# Patient Record
Sex: Male | Born: 1955 | Race: White | Hispanic: No | Smoking: Never smoker
Health system: Southern US, Community
[De-identification: ages and names within clinical notes are randomized; demographics above are authoritative.]

## PROBLEM LIST (undated history)

## (undated) DIAGNOSIS — T8859XA Other complications of anesthesia, initial encounter: Secondary | ICD-10-CM

## (undated) DIAGNOSIS — R112 Nausea with vomiting, unspecified: Secondary | ICD-10-CM

## (undated) DIAGNOSIS — E785 Hyperlipidemia, unspecified: Secondary | ICD-10-CM

## (undated) DIAGNOSIS — N189 Chronic kidney disease, unspecified: Secondary | ICD-10-CM

## (undated) DIAGNOSIS — I209 Angina pectoris, unspecified: Secondary | ICD-10-CM

## (undated) DIAGNOSIS — Z9889 Other specified postprocedural states: Secondary | ICD-10-CM

## (undated) DIAGNOSIS — M1990 Unspecified osteoarthritis, unspecified site: Secondary | ICD-10-CM

## (undated) DIAGNOSIS — T4145XA Adverse effect of unspecified anesthetic, initial encounter: Secondary | ICD-10-CM

## (undated) DIAGNOSIS — G039 Meningitis, unspecified: Secondary | ICD-10-CM

## (undated) DIAGNOSIS — L0390 Cellulitis, unspecified: Secondary | ICD-10-CM

## (undated) HISTORY — PX: COLONOSCOPY: SHX174

## (undated) HISTORY — PX: APPENDECTOMY: SHX54

## (undated) HISTORY — DX: Cellulitis, unspecified: L03.90

## (undated) HISTORY — PX: KNEE ARTHROPLASTY: SHX992

## (undated) HISTORY — DX: Meningitis, unspecified: G03.9

## (undated) HISTORY — PX: CHOLECYSTECTOMY: SHX55

## (undated) HISTORY — PX: DENTAL SURGERY: SHX609

## (undated) HISTORY — PX: ELBOW SURGERY: SHX618

---

## 2010-12-11 DIAGNOSIS — T8454XA Infection and inflammatory reaction due to internal left knee prosthesis, initial encounter: Secondary | ICD-10-CM | POA: Insufficient documentation

## 2010-12-11 HISTORY — PX: IRRIGATION AND DEBRIDEMENT KNEE: SHX5185

## 2010-12-11 HISTORY — DX: Infection and inflammatory reaction due to internal left knee prosthesis, initial encounter: T84.54XA

## 2011-12-21 DIAGNOSIS — M25569 Pain in unspecified knee: Secondary | ICD-10-CM

## 2011-12-21 DIAGNOSIS — M24569 Contracture, unspecified knee: Secondary | ICD-10-CM

## 2011-12-21 HISTORY — DX: Contracture, unspecified knee: M24.569

## 2011-12-21 HISTORY — DX: Pain in unspecified knee: M25.569

## 2014-04-11 ENCOUNTER — Other Ambulatory Visit: Payer: Self-pay | Admitting: Orthopaedic Surgery

## 2014-04-14 ENCOUNTER — Other Ambulatory Visit (HOSPITAL_COMMUNITY): Payer: Self-pay

## 2014-04-16 ENCOUNTER — Other Ambulatory Visit (HOSPITAL_COMMUNITY): Payer: Self-pay

## 2014-04-29 ENCOUNTER — Encounter (HOSPITAL_COMMUNITY): Admission: RE | Payer: Self-pay | Source: Ambulatory Visit

## 2014-04-29 ENCOUNTER — Inpatient Hospital Stay (HOSPITAL_COMMUNITY): Admission: RE | Admit: 2014-04-29 | Payer: Self-pay | Source: Ambulatory Visit | Admitting: Orthopaedic Surgery

## 2014-04-29 SURGERY — ARTHROPLASTY, HIP, TOTAL, ANTERIOR APPROACH
Anesthesia: Choice | Laterality: Left

## 2014-05-22 ENCOUNTER — Other Ambulatory Visit: Payer: Self-pay | Admitting: Orthopaedic Surgery

## 2014-06-27 ENCOUNTER — Encounter (HOSPITAL_COMMUNITY): Payer: Self-pay

## 2014-06-27 ENCOUNTER — Other Ambulatory Visit: Payer: Self-pay | Admitting: Orthopaedic Surgery

## 2014-06-27 ENCOUNTER — Encounter (HOSPITAL_COMMUNITY)
Admission: RE | Admit: 2014-06-27 | Discharge: 2014-06-27 | Disposition: A | Discharge status: Home / Self Care | Payer: PRIVATE HEALTH INSURANCE | Source: Ambulatory Visit | Attending: Orthopaedic Surgery | Admitting: Orthopaedic Surgery

## 2014-06-27 DIAGNOSIS — Z01818 Encounter for other preprocedural examination: Secondary | ICD-10-CM | POA: Insufficient documentation

## 2014-06-27 DIAGNOSIS — Z01810 Encounter for preprocedural cardiovascular examination: Secondary | ICD-10-CM | POA: Diagnosis not present

## 2014-06-27 DIAGNOSIS — Z01812 Encounter for preprocedural laboratory examination: Secondary | ICD-10-CM | POA: Diagnosis not present

## 2014-06-27 HISTORY — DX: Unspecified osteoarthritis, unspecified site: M19.90

## 2014-06-27 HISTORY — DX: Other specified postprocedural states: Z98.890

## 2014-06-27 HISTORY — DX: Other complications of anesthesia, initial encounter: T88.59XA

## 2014-06-27 HISTORY — DX: Hyperlipidemia, unspecified: E78.5

## 2014-06-27 HISTORY — DX: Adverse effect of unspecified anesthetic, initial encounter: T41.45XA

## 2014-06-27 HISTORY — DX: Nausea with vomiting, unspecified: R11.2

## 2014-06-27 LAB — URINALYSIS, ROUTINE W REFLEX MICROSCOPIC
Bilirubin Urine: NEGATIVE
Glucose, UA: NEGATIVE mg/dL
Hgb urine dipstick: NEGATIVE
Ketones, ur: NEGATIVE mg/dL
Leukocytes, UA: NEGATIVE
Nitrite: NEGATIVE
PH: 5 (ref 5.0–8.0)
Protein, ur: NEGATIVE mg/dL
Specific Gravity, Urine: 1.018 (ref 1.005–1.030)
Urobilinogen, UA: 0.2 mg/dL (ref 0.0–1.0)

## 2014-06-27 LAB — CBC WITH DIFFERENTIAL/PLATELET
BASOS PCT: 1 % (ref 0–1)
Basophils Absolute: 0.1 10*3/uL (ref 0.0–0.1)
EOS ABS: 0.2 10*3/uL (ref 0.0–0.7)
EOS PCT: 4 % (ref 0–5)
HEMATOCRIT: 49.9 % (ref 39.0–52.0)
Hemoglobin: 16.5 g/dL (ref 13.0–17.0)
LYMPHS PCT: 25 % (ref 12–46)
Lymphs Abs: 1.5 10*3/uL (ref 0.7–4.0)
MCH: 30.7 pg (ref 26.0–34.0)
MCHC: 33.1 g/dL (ref 30.0–36.0)
MCV: 92.8 fL (ref 78.0–100.0)
Monocytes Absolute: 0.6 10*3/uL (ref 0.1–1.0)
Monocytes Relative: 9 % (ref 3–12)
Neutro Abs: 3.7 10*3/uL (ref 1.7–7.7)
Neutrophils Relative %: 61 % (ref 43–77)
PLATELETS: 234 10*3/uL (ref 150–400)
RBC: 5.38 MIL/uL (ref 4.22–5.81)
RDW: 13.4 % (ref 11.5–15.5)
WBC: 6 10*3/uL (ref 4.0–10.5)

## 2014-06-27 LAB — BASIC METABOLIC PANEL
Anion gap: 8 (ref 5–15)
BUN: 11 mg/dL (ref 6–23)
CALCIUM: 9.4 mg/dL (ref 8.4–10.5)
CO2: 25 mmol/L (ref 19–32)
Chloride: 104 mmol/L (ref 96–112)
Creatinine, Ser: 0.82 mg/dL (ref 0.50–1.35)
Glucose, Bld: 127 mg/dL — ABNORMAL HIGH (ref 70–99)
POTASSIUM: 4.9 mmol/L (ref 3.5–5.1)
SODIUM: 137 mmol/L (ref 135–145)

## 2014-06-27 LAB — SURGICAL PCR SCREEN
MRSA, PCR: NEGATIVE
Staphylococcus aureus: NEGATIVE

## 2014-06-27 LAB — APTT: APTT: 30 s (ref 24–37)

## 2014-06-27 LAB — PROTIME-INR
INR: 1.01 (ref 0.00–1.49)
Prothrombin Time: 13.4 seconds (ref 11.6–15.2)

## 2014-06-27 NOTE — Progress Notes (Signed)
Patient arrived to PAT. [name] (Interpreter at chair side) during PAT visit. Nurse also called patients sister [name] to chair side during PAT visit. PCP is [name]. Patient denied having any cardiac or pulmonary issues. Patient informed Nurse that he had a stress test in 2011 in [place] however patient and sister were both unaware of place or physician. Will attempt to request records from [facility]. Patient denied having a cardiac cath or sleep study.  During PAT visit Nurse noticed that patients DOB was [birth year] and patient informed Nurse that his DOB is [birth year]. Nurse called admissions and had it changed. When asked about procedure patient was having done patient informed Nurse that he was having surgery on his right hip. Consent form stated surgery was on "left" hip. Nurse then called Sandi Raveling at Dr. Jerald Kief office and left a voicemail informing her of this. [name] returned call and stated that she would modify consent form. Consent will be signed DOS. Patient informed of this.

## 2014-06-27 NOTE — Pre-Procedure Instructions (Signed)
Zachary Lopez  06/27/2014   Your procedure is scheduled on:  Tuesday July 08, 2014 at 12:45 PM.  Report to [facility] Admitting at 10:45 AM.  Call this number if you have problems the morning of surgery: [phone]   Remember:   Do not eat food or drink liquids after midnight.   Take these medicines the morning of surgery with A SIP OF WATER: Acetaminophen (Tylenol) if needed   Please stop taking any vitamins, herbal medications, Advil, Motrin, Alleve, etc on Tuesday April 19th   Do not wear jewelry.  Do not wear lotions, powders, or cologne.   Men may shave face and neck.  Do not bring valuables to the hospital.  [facility] is not responsible for any belongings or valuables.               Contacts, dentures or bridgework may not be worn into surgery.  Leave suitcase in the car. After surgery it may be brought to your room.  For patients admitted to the hospital, discharge time is determined by your treatment team.               Patients discharged the day of surgery will not be allowed to drive home.  Name and phone number of your driver:   Special Instructions: Shower using CHG soap the night before and the morning of your surgery   Please read over the following fact sheets that you were given: Pain Booklet, Coughing and Deep Breathing, Total Joint Packet, MRSA Information and Surgical Site Infection Prevention

## 2014-07-07 ENCOUNTER — Other Ambulatory Visit: Payer: Self-pay | Admitting: Orthopaedic Surgery

## 2014-07-07 MED ORDER — LACTATED RINGERS IV SOLN
INTRAVENOUS | Status: DC
  Administered 2014-07-08 (×2): via INTRAVENOUS

## 2014-07-07 MED ORDER — CEFAZOLIN SODIUM-DEXTROSE 2-3 GM-% IV SOLR
2.0000 g | INTRAVENOUS | Status: AC
Start: 2014-07-08 — End: 2014-07-08
  Administered 2014-07-08: 2 g via INTRAVENOUS
  Filled 2014-07-07: qty 50

## 2014-07-07 MED ORDER — CHLORHEXIDINE GLUCONATE 4 % EX LIQD
60.0000 mL | Freq: Once | CUTANEOUS | Status: DC
  Filled 2014-07-07: qty 60

## 2014-07-07 NOTE — Progress Notes (Signed)
I called to inform the patient that there has been a time change for surgery in the am and new arrival time of 9. I spoke with the sister and she said there is no way they can be here at that time d/t someone coming from [place] of [place] to bring him. I told her that she needs to call Dr.Dalldorf's office and speak with his scheduler and let her know of concern. She states she will do that.

## 2014-07-07 NOTE — Progress Notes (Signed)
Patient's surgery was rescheduled to 0930, I called patient' sister-in-law, ,Winona Legato and asked him to arrive at 0730.  Mrs.  McKenzie stated that patient can not arrive until approximately 1030, "someone is coming from [place] to bring him and that is the [place], I spoke with Caralyn Guile today and she said she would work on it."  (Patient had been called earlier and instructed to arrive at 0900.)  I called and notified  Loni Dolly, [place] , he said he would work on re arranging patients. Patient was then rescheduled for 11:05, I spoke with Darlene at [place] desk and she said that PA did report to her that patient can not arrive until approx 10:30.

## 2014-07-07 NOTE — H&P (Signed)
TOTAL HIP ADMISSION H&P  Patient is admitted for right total hip arthroplasty.  Subjective:  Chief Complaint: right hip pain  HPI: Zachary Lopez, 59 y.o. male, has a history of pain and functional disability in the right hip(s) due to arthritis and patient has failed non-surgical conservative treatments for greater than 12 weeks to include NSAID's and/or analgesics, flexibility and strengthening excercises, use of assistive devices, weight reduction as appropriate and activity modification.  Onset of symptoms was gradual starting 5 years ago with gradually worsening course since that time.The patient noted no past surgery on the right hip(s).  Patient currently rates pain in the right hip at 10 out of 10 with activity. Patient has night pain, worsening of pain with activity and weight bearing, trendelenberg gait, pain that interfers with activities of daily living and crepitus. Patient has evidence of subchondral cysts, subchondral sclerosis, periarticular osteophytes and joint space narrowing by imaging studies. This condition presents safety issues increasing the risk of falls.  There is no current active infection.  There are no active problems to display for this patient.  Past Medical History  Diagnosis Date  . Complication of anesthesia   . PONV (postoperative nausea and vomiting)   . Hyperlipemia   . Arthritis     Past Surgical History  Procedure Laterality Date  . Knee arthroplasty Left   . Appendectomy    . Elbow surgery Left   . Colonoscopy    . Dental surgery      No prescriptions prior to admission   No Known Allergies  History  Substance Use Topics  . Smoking status: Never Smoker   . Smokeless tobacco: Not on file  . Alcohol Use: No    No family history on file.   Review of Systems  Musculoskeletal: Positive for joint pain.       Right hip  All other systems reviewed and are negative.   Objective:  Physical Exam  Constitutional: He is oriented to person,  place, and time. He appears well-developed and well-nourished.  HENT:  Head: Normocephalic and atraumatic.  Eyes: Pupils are equal, round, and reactive to light.  Cardiovascular: Normal rate and regular rhythm.   Respiratory: Effort normal.  GI: Soft.  Musculoskeletal:  Right hip motion is quite limited. He walks with a markedly altered gait. His leg lengths are roughly equal. I do not see much of a hip flexion contracture. Both of his knees move relatively well. Sensation and motor function are intact in his feet with palpable pulses on both sides.  Neurological: He is alert and oriented to person, place, and time.  Skin: Skin is warm and dry.  Psychiatric: He has a normal mood and affect. His behavior is normal. Judgment and thought content normal.    Vital signs in last 24 hours:    Labs:   There is no height or weight on file to calculate BMI.   Imaging Review Plain radiographs demonstrate severe degenerative joint disease of the right hip(s). The bone quality appears to be good for age and reported activity level.  Assessment/Plan:  End stage primary arthritis, right hip(s)  The patient history, physical examination, clinical judgement of the provider and imaging studies are consistent with end stage degenerative joint disease of the right hip(s) and total hip arthroplasty is deemed medically necessary. The treatment options including medical management, injection therapy, arthroscopy and arthroplasty were discussed at length. The risks and benefits of total hip arthroplasty were presented and reviewed. The risks due to aseptic  loosening, infection, stiffness, dislocation/subluxation,  thromboembolic complications and other imponderables were discussed.  The patient acknowledged the explanation, agreed to proceed with the plan and consent was signed. Patient is being admitted for inpatient treatment for surgery, pain control, PT, OT, prophylactic antibiotics, VTE prophylaxis,  progressive ambulation and ADL's and discharge planning.The patient is planning to be discharged to skilled nursing facility

## 2014-07-08 ENCOUNTER — Encounter (HOSPITAL_COMMUNITY)
Admission: RE | Disposition: A | Discharge status: Home Health | Payer: Self-pay | Source: Ambulatory Visit | Attending: Orthopaedic Surgery

## 2014-07-08 ENCOUNTER — Inpatient Hospital Stay (HOSPITAL_COMMUNITY): Payer: PRIVATE HEALTH INSURANCE | Admitting: Anesthesiology

## 2014-07-08 ENCOUNTER — Encounter (HOSPITAL_COMMUNITY): Payer: Self-pay | Admitting: *Deleted

## 2014-07-08 ENCOUNTER — Inpatient Hospital Stay (HOSPITAL_COMMUNITY): Payer: PRIVATE HEALTH INSURANCE

## 2014-07-08 ENCOUNTER — Inpatient Hospital Stay (HOSPITAL_COMMUNITY)
Admission: RE | Admit: 2014-07-08 | Discharge: 2014-07-11 | DRG: 470 | Disposition: A | Discharge status: Home Health | Payer: PRIVATE HEALTH INSURANCE | Source: Ambulatory Visit | Attending: Orthopaedic Surgery | Admitting: Orthopaedic Surgery

## 2014-07-08 DIAGNOSIS — E785 Hyperlipidemia, unspecified: Secondary | ICD-10-CM | POA: Diagnosis present

## 2014-07-08 DIAGNOSIS — Z7982 Long term (current) use of aspirin: Secondary | ICD-10-CM

## 2014-07-08 DIAGNOSIS — M25551 Pain in right hip: Secondary | ICD-10-CM | POA: Diagnosis present

## 2014-07-08 DIAGNOSIS — M1611 Unilateral primary osteoarthritis, right hip: Secondary | ICD-10-CM

## 2014-07-08 DIAGNOSIS — Z419 Encounter for procedure for purposes other than remedying health state, unspecified: Secondary | ICD-10-CM

## 2014-07-08 HISTORY — DX: Unilateral primary osteoarthritis, right hip: M16.11

## 2014-07-08 HISTORY — PX: TOTAL HIP ARTHROPLASTY: SHX124

## 2014-07-08 SURGERY — ARTHROPLASTY, HIP, TOTAL, ANTERIOR APPROACH
Anesthesia: Monitor Anesthesia Care | Site: Hip | Laterality: Right

## 2014-07-08 MED ORDER — PHENYLEPHRINE 40 MCG/ML (10ML) SYRINGE FOR IV PUSH (FOR BLOOD PRESSURE SUPPORT)
PREFILLED_SYRINGE | INTRAVENOUS | Status: AC
  Filled 2014-07-08: qty 20

## 2014-07-08 MED ORDER — HYDROCODONE-ACETAMINOPHEN 5-325 MG PO TABS
1.0000 | ORAL_TABLET | ORAL | Status: DC | PRN
  Administered 2014-07-09 – 2014-07-11 (×8): 2 via ORAL
  Filled 2014-07-08 (×8): qty 2

## 2014-07-08 MED ORDER — PROPOFOL 10 MG/ML IV BOLUS
INTRAVENOUS | Status: AC
  Filled 2014-07-08: qty 20

## 2014-07-08 MED ORDER — SODIUM CHLORIDE 0.9 % IJ SOLN
INTRAMUSCULAR | Status: AC
  Filled 2014-07-08: qty 10

## 2014-07-08 MED ORDER — ONDANSETRON HCL 4 MG/2ML IJ SOLN
4.0000 mg | Freq: Four times a day (QID) | INTRAMUSCULAR | Status: DC | PRN
  Administered 2014-07-08: 4 mg via INTRAVENOUS
  Filled 2014-07-08 (×2): qty 2

## 2014-07-08 MED ORDER — FENTANYL CITRATE (PF) 250 MCG/5ML IJ SOLN
INTRAMUSCULAR | Status: AC
  Filled 2014-07-08: qty 5

## 2014-07-08 MED ORDER — DIPHENHYDRAMINE HCL 12.5 MG/5ML PO ELIX: 12.5000 mg | ORAL_SOLUTION | ORAL | Status: DC | PRN

## 2014-07-08 MED ORDER — 0.9 % SODIUM CHLORIDE (POUR BTL) OPTIME
TOPICAL | Status: DC | PRN
  Administered 2014-07-08: 1000 mL

## 2014-07-08 MED ORDER — ALUM & MAG HYDROXIDE-SIMETH 200-200-20 MG/5ML PO SUSP: 30.0000 mL | ORAL | Status: DC | PRN

## 2014-07-08 MED ORDER — SODIUM CHLORIDE 0.9 % IV SOLN
Freq: Once | INTRAVENOUS | Status: DC
Start: 2014-07-08 — End: 2014-07-08

## 2014-07-08 MED ORDER — HYDROMORPHONE HCL 1 MG/ML IJ SOLN: 0.2500 mg | INTRAMUSCULAR | Status: DC | PRN

## 2014-07-08 MED ORDER — MIDAZOLAM HCL 2 MG/2ML IJ SOLN
INTRAMUSCULAR | Status: AC
  Filled 2014-07-08: qty 2

## 2014-07-08 MED ORDER — OXYCODONE HCL 5 MG/5ML PO SOLN: 5.0000 mg | Freq: Once | ORAL | Status: DC | PRN

## 2014-07-08 MED ORDER — DOCUSATE SODIUM 100 MG PO CAPS
100.0000 mg | ORAL_CAPSULE | Freq: Two times a day (BID) | ORAL | Status: DC
  Administered 2014-07-08 – 2014-07-11 (×6): 100 mg via ORAL
  Filled 2014-07-08 (×6): qty 1

## 2014-07-08 MED ORDER — METHOCARBAMOL 1000 MG/10ML IJ SOLN
500.0000 mg | Freq: Four times a day (QID) | INTRAVENOUS | Status: DC | PRN
  Filled 2014-07-08: qty 5

## 2014-07-08 MED ORDER — FENTANYL CITRATE (PF) 250 MCG/5ML IJ SOLN
INTRAMUSCULAR | Status: DC | PRN
  Administered 2014-07-08 (×3): 50 ug via INTRAVENOUS

## 2014-07-08 MED ORDER — TRANEXAMIC ACID 1000 MG/10ML IV SOLN
1000.0000 mg | INTRAVENOUS | Status: AC
  Administered 2014-07-08: 1000 mg via INTRAVENOUS
  Filled 2014-07-08: qty 10

## 2014-07-08 MED ORDER — METHOCARBAMOL 500 MG PO TABS
500.0000 mg | ORAL_TABLET | Freq: Four times a day (QID) | ORAL | Status: DC | PRN
  Administered 2014-07-08 – 2014-07-11 (×4): 500 mg via ORAL
  Filled 2014-07-08 (×5): qty 1

## 2014-07-08 MED ORDER — ASPIRIN EC 325 MG PO TBEC
325.0000 mg | DELAYED_RELEASE_TABLET | Freq: Two times a day (BID) | ORAL | Status: DC
Start: 2014-07-09 — End: 2014-07-11
  Administered 2014-07-09 – 2014-07-11 (×5): 325 mg via ORAL
  Filled 2014-07-08 (×5): qty 1

## 2014-07-08 MED ORDER — PHENOL 1.4 % MT LIQD: 1.0000 | OROMUCOSAL | Status: DC | PRN

## 2014-07-08 MED ORDER — STERILE WATER FOR INJECTION IJ SOLN
INTRAMUSCULAR | Status: AC
  Filled 2014-07-08: qty 10

## 2014-07-08 MED ORDER — MIDAZOLAM HCL 5 MG/5ML IJ SOLN
INTRAMUSCULAR | Status: DC | PRN
  Administered 2014-07-08: 2 mg via INTRAVENOUS

## 2014-07-08 MED ORDER — LIDOCAINE HCL (CARDIAC) 20 MG/ML IV SOLN
INTRAVENOUS | Status: DC | PRN
  Administered 2014-07-08: 50 mg via INTRAVENOUS

## 2014-07-08 MED ORDER — ONDANSETRON HCL 4 MG PO TABS
4.0000 mg | ORAL_TABLET | Freq: Four times a day (QID) | ORAL | Status: DC | PRN
  Filled 2014-07-08: qty 1

## 2014-07-08 MED ORDER — EPHEDRINE SULFATE 50 MG/ML IJ SOLN
INTRAMUSCULAR | Status: AC
  Filled 2014-07-08: qty 1

## 2014-07-08 MED ORDER — BUPIVACAINE HCL (PF) 0.5 % IJ SOLN
INTRAMUSCULAR | Status: DC | PRN
  Administered 2014-07-08: 3 mL

## 2014-07-08 MED ORDER — MENTHOL 3 MG MT LOZG: 1.0000 | LOZENGE | OROMUCOSAL | Status: DC | PRN

## 2014-07-08 MED ORDER — ACETAMINOPHEN 650 MG RE SUPP: 650.0000 mg | Freq: Four times a day (QID) | RECTAL | Status: DC | PRN

## 2014-07-08 MED ORDER — METOCLOPRAMIDE HCL 5 MG/ML IJ SOLN: 5.0000 mg | Freq: Three times a day (TID) | INTRAMUSCULAR | Status: DC | PRN

## 2014-07-08 MED ORDER — LACTATED RINGERS IV SOLN: INTRAVENOUS | Status: DC

## 2014-07-08 MED ORDER — PROPOFOL INFUSION 10 MG/ML OPTIME
INTRAVENOUS | Status: DC | PRN
  Administered 2014-07-08: 50 ug/kg/min via INTRAVENOUS

## 2014-07-08 MED ORDER — ACETAMINOPHEN 325 MG PO TABS: 650.0000 mg | ORAL_TABLET | Freq: Four times a day (QID) | ORAL | Status: DC | PRN

## 2014-07-08 MED ORDER — BISACODYL 5 MG PO TBEC
5.0000 mg | DELAYED_RELEASE_TABLET | Freq: Every day | ORAL | Status: DC | PRN
  Filled 2014-07-08: qty 1

## 2014-07-08 MED ORDER — PROPOFOL 10 MG/ML IV BOLUS
INTRAVENOUS | Status: DC | PRN
  Administered 2014-07-08 (×3): 40 mg via INTRAVENOUS

## 2014-07-08 MED ORDER — SUCCINYLCHOLINE CHLORIDE 20 MG/ML IJ SOLN
INTRAMUSCULAR | Status: AC
  Filled 2014-07-08: qty 1

## 2014-07-08 MED ORDER — MUPIROCIN 2 % EX OINT
1.0000 "application " | TOPICAL_OINTMENT | Freq: Once | CUTANEOUS | Status: AC
  Administered 2014-07-08: 1 via TOPICAL

## 2014-07-08 MED ORDER — LIDOCAINE HCL (CARDIAC) 20 MG/ML IV SOLN
INTRAVENOUS | Status: AC
  Filled 2014-07-08: qty 10

## 2014-07-08 MED ORDER — ROCURONIUM BROMIDE 50 MG/5ML IV SOLN
INTRAVENOUS | Status: AC
  Filled 2014-07-08: qty 1

## 2014-07-08 MED ORDER — METOCLOPRAMIDE HCL 5 MG PO TABS: 5.0000 mg | ORAL_TABLET | Freq: Three times a day (TID) | ORAL | Status: DC | PRN

## 2014-07-08 MED ORDER — CEFAZOLIN SODIUM-DEXTROSE 2-3 GM-% IV SOLR
2.0000 g | Freq: Four times a day (QID) | INTRAVENOUS | Status: AC
  Administered 2014-07-08 (×2): 2 g via INTRAVENOUS
  Filled 2014-07-08 (×2): qty 50

## 2014-07-08 MED ORDER — ROSUVASTATIN CALCIUM 10 MG PO TABS
10.0000 mg | ORAL_TABLET | Freq: Two times a day (BID) | ORAL | Status: DC
  Administered 2014-07-08 – 2014-07-11 (×6): 10 mg via ORAL
  Filled 2014-07-08 (×6): qty 1

## 2014-07-08 MED ORDER — ONDANSETRON HCL 4 MG/2ML IJ SOLN
INTRAMUSCULAR | Status: AC
  Filled 2014-07-08: qty 2

## 2014-07-08 MED ORDER — OXYCODONE HCL 5 MG PO TABS: 5.0000 mg | ORAL_TABLET | Freq: Once | ORAL | Status: DC | PRN

## 2014-07-08 MED ORDER — HYDROMORPHONE HCL 1 MG/ML IJ SOLN
0.5000 mg | INTRAMUSCULAR | Status: DC | PRN
  Administered 2014-07-08: 1 mg via INTRAVENOUS
  Filled 2014-07-08: qty 1

## 2014-07-08 SURGICAL SUPPLY — 55 items
BENZOIN TINCTURE PRP APPL 2/3 (GAUZE/BANDAGES/DRESSINGS) ×3 IMPLANT
BLADE SAW SGTL 18X1.27X75 (BLADE) ×2 IMPLANT
BLADE SAW SGTL 18X1.27X75MM (BLADE) ×1
BLADE SURG ROTATE 9660 (MISCELLANEOUS) IMPLANT
CAPT HIP TOTAL 2 ×3 IMPLANT
CELLS DAT CNTRL 66122 CELL SVR (MISCELLANEOUS) ×1 IMPLANT
CLOSURE STERI-STRIP 1/2X4 (GAUZE/BANDAGES/DRESSINGS) ×1
CLSR STERI-STRIP ANTIMIC 1/2X4 (GAUZE/BANDAGES/DRESSINGS) ×2 IMPLANT
COVER PERINEAL POST (MISCELLANEOUS) ×3 IMPLANT
COVER SURGICAL LIGHT HANDLE (MISCELLANEOUS) ×3 IMPLANT
DRAPE C-ARM 42X72 X-RAY (DRAPES) ×3 IMPLANT
DRAPE IMP U-DRAPE 54X76 (DRAPES) ×3 IMPLANT
DRAPE STERI IOBAN 125X83 (DRAPES) ×3 IMPLANT
DRAPE U-SHAPE 47X51 STRL (DRAPES) ×9 IMPLANT
DRSG AQUACEL AG ADV 3.5X10 (GAUZE/BANDAGES/DRESSINGS) ×3 IMPLANT
DURAPREP 26ML APPLICATOR (WOUND CARE) ×3 IMPLANT
ELECT BLADE 4.0 EZ CLEAN MEGAD (MISCELLANEOUS)
ELECT CAUTERY BLADE 6.4 (BLADE) ×3 IMPLANT
ELECT REM PT RETURN 9FT ADLT (ELECTROSURGICAL) ×3
ELECTRODE BLDE 4.0 EZ CLN MEGD (MISCELLANEOUS) IMPLANT
ELECTRODE REM PT RTRN 9FT ADLT (ELECTROSURGICAL) ×1 IMPLANT
FACESHIELD WRAPAROUND (MASK) ×9 IMPLANT
GLOVE BIO SURGEON STRL SZ7 (GLOVE) ×6 IMPLANT
GLOVE BIO SURGEON STRL SZ8 (GLOVE) ×15 IMPLANT
GLOVE BIOGEL PI IND STRL 6.5 (GLOVE) ×3 IMPLANT
GLOVE BIOGEL PI IND STRL 8 (GLOVE) ×2 IMPLANT
GLOVE BIOGEL PI INDICATOR 6.5 (GLOVE) ×6
GLOVE BIOGEL PI INDICATOR 8 (GLOVE) ×4
GLOVE SURG SS PI 7.0 STRL IVOR (GLOVE) ×6 IMPLANT
GOWN STRL REUS W/ TWL LRG LVL3 (GOWN DISPOSABLE) ×3 IMPLANT
GOWN STRL REUS W/ TWL XL LVL3 (GOWN DISPOSABLE) ×2 IMPLANT
GOWN STRL REUS W/TWL LRG LVL3 (GOWN DISPOSABLE) ×6
GOWN STRL REUS W/TWL XL LVL3 (GOWN DISPOSABLE) ×4
KIT BASIN OR (CUSTOM PROCEDURE TRAY) ×3 IMPLANT
KIT ROOM TURNOVER OR (KITS) ×3 IMPLANT
LINER BOOT UNIVERSAL DISP (MISCELLANEOUS) ×3 IMPLANT
MANIFOLD NEPTUNE II (INSTRUMENTS) ×3 IMPLANT
NS IRRIG 1000ML POUR BTL (IV SOLUTION) ×3 IMPLANT
PACK TOTAL JOINT (CUSTOM PROCEDURE TRAY) ×3 IMPLANT
PACK UNIVERSAL I (CUSTOM PROCEDURE TRAY) ×3 IMPLANT
PAD ARMBOARD 7.5X6 YLW CONV (MISCELLANEOUS) ×6 IMPLANT
RTRCTR WOUND ALEXIS 18CM MED (MISCELLANEOUS) ×3
STAPLER VISISTAT 35W (STAPLE) ×3 IMPLANT
SUT ETHIBOND NAB CT1 #1 30IN (SUTURE) ×6 IMPLANT
SUT VIC AB 0 CT1 27 (SUTURE)
SUT VIC AB 0 CT1 27XBRD ANBCTR (SUTURE) IMPLANT
SUT VIC AB 1 CT1 27 (SUTURE) ×2
SUT VIC AB 1 CT1 27XBRD ANBCTR (SUTURE) ×1 IMPLANT
SUT VIC AB 2-0 CT1 27 (SUTURE) ×2
SUT VIC AB 2-0 CT1 TAPERPNT 27 (SUTURE) ×1 IMPLANT
SUT VLOC 180 0 24IN GS25 (SUTURE) ×3 IMPLANT
TOWEL OR 17X24 6PK STRL BLUE (TOWEL DISPOSABLE) ×3 IMPLANT
TOWEL OR 17X26 10 PK STRL BLUE (TOWEL DISPOSABLE) ×6 IMPLANT
TRAY FOLEY CATH 14FR (SET/KITS/TRAYS/PACK) IMPLANT
WATER STERILE IRR 1000ML POUR (IV SOLUTION) ×6 IMPLANT

## 2014-07-08 NOTE — Transfer of Care (Signed)
Immediate Anesthesia Transfer of Care Note  Patient: Zachary Lopez  Procedure(s) Performed: Procedure(s): RIGHT  TOTAL HIP ARTHROPLASTY ANTERIOR APPROACH (Right)  Patient Location: PACU  Anesthesia Type:Spinal  Level of Consciousness: awake, alert  and oriented  Airway & Oxygen Therapy: Patient Spontanous Breathing and Patient connected to nasal cannula oxygen  Post-op Assessment: Report given to RN and Post -op Vital signs reviewed and stable  Post vital signs: Reviewed and stable  Last Vitals:  Filed Vitals:   07/08/14 1410  BP:   Pulse:   Temp: 36.6 C  Resp:     Complications: No apparent anesthesia complications

## 2014-07-08 NOTE — Anesthesia Preprocedure Evaluation (Addendum)
Anesthesia Evaluation  Patient identified by MRN, date of birth, ID band Patient awake    Reviewed: Allergy & Precautions, NPO status , Patient's Chart, lab work & pertinent test results  History of Anesthesia Complications (+) PONV  Airway Mallampati: II  TM Distance: >3 FB Neck ROM: Full    Dental  (+) Dental Advisory Given   Pulmonary neg pulmonary ROS,  breath sounds clear to auscultation        Cardiovascular negative cardio ROS  Rhythm:Regular     Neuro/Psych negative psych ROS   GI/Hepatic negative GI ROS, Neg liver ROS,   Endo/Other  negative endocrine ROS  Renal/GU negative Renal ROS     Musculoskeletal  (+) Arthritis -,   Abdominal   Peds  Hematology negative hematology ROS (+)   Anesthesia Other Findings   Reproductive/Obstetrics                           Anesthesia Physical Anesthesia Plan  ASA: III  Anesthesia Plan: MAC and Spinal   Post-op Pain Management:    Induction: Intravenous  Airway Management Planned: Natural Airway  Additional Equipment: None  Intra-op Plan:   Post-operative Plan:   Informed Consent: I have reviewed the patients History and Physical, chart, labs and discussed the procedure including the risks, benefits and alternatives for the proposed anesthesia with the patient or authorized representative who has indicated his/her understanding and acceptance.   Dental advisory given  Plan Discussed with: CRNA  Anesthesia Plan Comments:         Anesthesia Quick Evaluation

## 2014-07-08 NOTE — Interval H&P Note (Signed)
OK for surgery PD 

## 2014-07-08 NOTE — Anesthesia Postprocedure Evaluation (Signed)
  Anesthesia Post-op Note  Patient: Zachary Lopez  Procedure(s) Performed: Procedure(s): RIGHT  TOTAL HIP ARTHROPLASTY ANTERIOR APPROACH (Right)  Patient Location: PACU  Anesthesia Type:Spinal  Level of Consciousness: awake  Airway and Oxygen Therapy: Patient Spontanous Breathing  Post-op Pain: none  Post-op Assessment: Post-op Vital signs reviewed, Patient's Cardiovascular Status Stable, Respiratory Function Stable, Patent Airway, No signs of Nausea or vomiting and Pain level controlled  Post-op Vital Signs: Reviewed and stable  Last Vitals:  Filed Vitals:   07/08/14 1500  BP:   Pulse: 51  Temp:   Resp: 16    Complications: No apparent anesthesia complications

## 2014-07-08 NOTE — Anesthesia Procedure Notes (Signed)
Spinal Patient location during procedure: OR Staffing Anesthesiologist: Synia Douglass, CHRIS Preanesthetic Checklist Completed: patient identified, surgical consent, pre-op evaluation, timeout performed, IV checked, risks and benefits discussed and monitors and equipment checked Spinal Block Patient position: sitting Prep: site prepped and draped and DuraPrep Patient monitoring: heart rate, cardiac monitor, continuous pulse ox and blood pressure Approach: midline Location: L3-4 Injection technique: single-shot Needle Needle type: Pencan  Needle gauge: 24 G Needle length: 10 cm Assessment Sensory level: T6   

## 2014-07-08 NOTE — Progress Notes (Signed)
Report given to elise rn as caregiver 

## 2014-07-08 NOTE — Progress Notes (Signed)
Pt seen by Dr.Moser, updated on patient status, able slightly lift knee off bed, ok to transfer to floor

## 2014-07-08 NOTE — Progress Notes (Signed)
Reported to Dr. Rhona Raider that 1 dose of mupirocin was given to patient in nose.  Okay.

## 2014-07-08 NOTE — Op Note (Signed)
PRE-OP DIAGNOSIS:  RIGHT HIP DEGENERATIVE JOINT DISEASE POST-OP DIAGNOSIS:  same PROCEDURE: RIGHT TOTAL HIP ARTHROPLASTY ANTERIOR APPROACH ANESTHESIA:  Spinal SURGEON:  Melrose Nakayama MD ASSISTANT:  Loni Dolly PA-C   INDICATIONS FOR PROCEDURE:  The patient is a 59 y.o. male with a long history of a painful hip.  This has persisted despite multiple conservative measures.  The patient has persisted with pain and dysfunction making rest and activity difficult.  A total hip replacement is offered as surgical treatment.  Informed operative consent was obtained after discussion of possible complications including reaction to anesthesia, infection, neurovascular injury, dislocation, DVT, PE, and death.  The importance of the postoperative rehab program to optimize result was stressed with the patient.  SUMMARY OF FINDINGS AND PROCEDURE:  Under general anesthesia through a anterior approach an the Hana table a right THR was performed.  The patient had severe degenerative change and excellent bone quality.  We used DePuy components to replace the hip and these were size KA 12 Corail femur capped with a -2 56mm stainless steel hip ball.  On the acetabular side we used a size 52 Gription shell with a  plus 4 neutral polyethylene liner.  We did use a hole eliminator.  Loni Dolly PA-C assisted throughout and was invaluable to the completion of the case in that he helped position and retract while I performed the procedure.  He also closed simultaneously to help minimize OR time.  I used fluoroscopy throughout the case to check position of implants and leg lengths and read all of these views myself.  DESCRIPTION OF PROCEDURE:  The patient was taken to the OR suite where general anesthetic was applied.  The patient was then positioned on the Hana table supine.  All bony prominences were appropriately padded.  Prep and drape was then performed in normal sterile fashion.  The patient was given kefzol preoperative  antibiotic and an appropriate time out was performed.  We then took an anterior approach to the right hip.  Dissection was taken through adipose to the tensor fascia lata fascia.  This structure was incised longitudinally and we dissected in the intermuscular interval just medial to this muscle.  Cobra retractors were placed superior and inferior to the femoral neck superficial to the capsule.  A capsular incision was then made and the retractors were placed along the femoral neck.  Xray was brought in to get a good level for the femoral neck cut which was made with an oscillating saw and osteotome.  The femoral head was removed with a corkscrew.  The acetabulum was exposed and some labral tissues were excised. Reaming was taken to the inside wall of the pelvis and sequentially up to 1 mm smaller than the actual component.  A trial of components was done and then the aforementioned acetabular shell was placed in appropriate tilt and anteversion confirmed by fluoroscopy. The liner was placed along with the hole eliminator and attention was turned to the femur.  The leg was brought down and over into adduction and the elevator bar was used to raise the femur up gently in the wound.  The piriformis was released with care taken to preserve the obturator internus attachment and all of the posterior capsule. The femur was reamed and then broached to the appropriate size.  A trial reduction was done and the aforementioned head and neck assembly gave [place] the best stability in extension with external rotation.  Leg lengths were felt to be about equal by fluoroscopic  exam.  The trial components were removed and the wound irrigated.  We then placed the femoral component in appropriate anteversion.  The head was applied to a dry stem neck and the hip again reduced.  It was again stable in the aforementioned position.  The would was irrigated again followed by re-approximation of anterior capsule with ethibond suture. Tensor  fascia was repaired with V-loc suture  followed by subcutaneous closure with #O and #2 undyed vicryl.  Skin was closed with subQ stitch and steristrips followed by a sterile dressing.  EBL and IOF can be obtained from anesthesia records.  DISPOSITION:  The patient was extubated in the OR and taken to PACU in stable condition to be admitted to the Orthopedic Surgery for appropriate post-op care to include perioperative antibiotics and DVT prophylaxis.

## 2014-07-09 ENCOUNTER — Encounter (HOSPITAL_COMMUNITY): Payer: Self-pay | Admitting: Orthopaedic Surgery

## 2014-07-09 NOTE — Progress Notes (Signed)
Multiple calls made to interpreter line for the deaf and hard of hearing [phone]) to schedule appointment for interpreter to come in today. Unable to leave message in voice box for scheduling. Several attempts made with the same message "the recording has been paused due to silence". Will pass off in report to day nurse to call interpreter line again in hope of getting through.

## 2014-07-09 NOTE — Evaluation (Signed)
Physical Therapy Evaluation Patient Details Name: Zachary Lopez MRN: [id number] DOB: Oct 12, 1955 Today's Date: 07/09/2014   History of Present Illness  Patient is a 59 y/o male s/p R THA. PMH of HLD.   Clinical Impression  Patient presents with pain and post surgical deficits RLE s/p R THA impacting mobility. Requires Min guard for safety during transfers and gait secondary to balance deficits. Pt has no support at home as he lives alone and would benefit from [facility] to improve transfers, gait, balance and safe mobility so pt can maximize independence prior to returning home.    Follow Up Recommendations SNF;Supervision/Assistance - 24 hour    Equipment Recommendations  None recommended by PT    Recommendations for Other Services       Precautions / Restrictions Precautions Precautions: None Precaution Comments: Direct anterior approach. Restrictions Weight Bearing Restrictions: Yes RLE Weight Bearing: Weight bearing as tolerated      Mobility  Bed Mobility               General bed mobility comments: Sitting in chair upon PT arrival.   Transfers Overall transfer level: Needs assistance Equipment used: Rolling walker (2 wheeled) Transfers: Sit to/from Stand Sit to Stand: Min guard         General transfer comment: Min guard for safety. Stood from [employer], from toilet x1.   Ambulation/Gait Ambulation/Gait assistance: Min guard Ambulation Distance (Feet): 100 Feet Assistive device: Rolling walker (2 wheeled) Gait Pattern/deviations: Step-through pattern;Decreased stride length;Decreased stance time - right;Decreased step length - left;Trunk flexed   Gait velocity interpretation: Below normal speed for age/gender General Gait Details: Pt with slow, guarded gait. Cues for RW management and safety.   Stairs            Wheelchair Mobility    Modified Rankin (Stroke Patients Only)       Balance Overall balance assessment: Needs  assistance Sitting-balance support: Feet supported;No upper extremity supported Sitting balance-Leahy Scale: Good     Standing balance support: During functional activity Standing balance-Leahy Scale: Fair                               Pertinent Vitals/Pain Pain Assessment: 0-10 Pain Score: 6  Pain Location: right hip Pain Descriptors / Indicators: Sore Pain Intervention(s): Monitored during session;Repositioned;Ice applied    Home Living Family/patient expects to be discharged to:: Skilled nursing facility Living Arrangements: Alone                    Prior Function Level of Independence: Independent with assistive device(s)         Comments: Pt using RW PTA.     Hand Dominance        Extremity/Trunk Assessment   Upper Extremity Assessment: Defer to OT evaluation           Lower Extremity Assessment: RLE deficits/detail RLE Deficits / Details: AROM WFL except limited in hip flexion secondary to pain.       Communication   Communication: Deaf (Interpreter used - Janetta Hora with [place])  Cognition Arousal/Alertness: Awake/alert Behavior During Therapy: WFL for tasks assessed/performed Overall Cognitive Status: Within Functional Limits for tasks assessed                      General Comments      Exercises Total Joint Exercises Ankle Circles/Pumps: Both;15 reps;Seated Quad Sets: Right;10 reps;Seated Long Arc Quad: Right;10 reps;Seated  Assessment/Plan    PT Assessment Patient needs continued PT services  PT Diagnosis Acute pain;Difficulty walking   PT Problem List Decreased strength;Pain;Decreased range of motion;Impaired sensation;Decreased activity tolerance;Decreased balance;Decreased mobility  PT Treatment Interventions Balance training;Gait training;Patient/family education;Functional mobility training;Therapeutic activities;Therapeutic exercise   PT Goals (Current goals can be found in the Care Plan  section) Acute Rehab PT Goals Patient Stated Goal: to go to rehab PT Goal Formulation: With patient Time For Goal Achievement: 07/23/14 Potential to Achieve Goals: Good    Frequency 7X/week   Barriers to discharge Decreased caregiver support Pt lives alone    Co-evaluation               End of Session Equipment Utilized During Treatment: Gait belt Activity Tolerance: Patient tolerated treatment well Patient left: in chair;with call bell/phone within reach Nurse Communication: Mobility status         Time: 1045-1105 PT Time Calculation (min) (ACUTE ONLY): 20 min   Charges:   PT Evaluation $Initial PT Evaluation Tier I: 1 Procedure     PT G CodesCandy Sledge A 07/11/2014, 11:49 AM  Candy Sledge, PT, DPT [phone]

## 2014-07-09 NOTE — Progress Notes (Signed)
Utilization review completed.  

## 2014-07-09 NOTE — Progress Notes (Signed)
OT Cancellation Note  Patient Details Name: Zachary Lopez MRN: [id number] DOB: 1955-06-30   Cancelled Treatment:    Reason Eval/Treat Not Completed: Other (comment) Pt plan is SNF. No apparent immediate acute care OT needs, therefore will defer OT to SNF. If OT eval is needed please call Acute Rehab Dept. at [phone] or text page OT at [phone].  [place], OTR/L  [phone] 07/09/2014 07/09/2014, 1:14 PM

## 2014-07-09 NOTE — Progress Notes (Signed)
Subjective: 1 Day Post-Op Procedure(s) (LRB): RIGHT  TOTAL HIP ARTHROPLASTY ANTERIOR APPROACH (Right)  Activity level:  wbat Diet tolerance:  Eating well Voiding:  ok Patient reports pain as mild.    Objective: Vital signs in last 24 hours: Temp:  [97.3 F (36.3 C)-98.7 F (37.1 C)] 98.7 F (37.1 C) (04/27 0515) Pulse Rate:  [51-89] 88 (04/27 0515) Resp:  [11-20] 18 (04/27 0515) BP: (88-169)/(60-92) 130/66 mmHg (04/27 0515) SpO2:  [94 %-99 %] 97 % (04/27 0515) Weight:  [73.12 kg (161 lb 3.2 oz)] 73.12 kg (161 lb 3.2 oz) (04/26 1026)  Labs: No results for input(s): HGB in the last 72 hours. No results for input(s): WBC, RBC, HCT, PLT in the last 72 hours. No results for input(s): NA, K, CL, CO2, BUN, CREATININE, GLUCOSE, CALCIUM in the last 72 hours. No results for input(s): LABPT, INR in the last 72 hours.  Physical Exam:  Neurologically intact ABD soft Neurovascular intact Sensation intact distally Intact pulses distally Dorsiflexion/Plantar flexion intact Incision: dressing C/D/I and no drainage No cellulitis present Compartment soft  Assessment/Plan:  1 Day Post-Op Procedure(s) (LRB): RIGHT  TOTAL HIP ARTHROPLASTY ANTERIOR APPROACH (Right) Advance diet Up with therapy D/C IV fluids Plan for discharge tomorrow Discharge to SNF [facility] if doing well and cleared by PT. Continue on ASA 325mg  BID x 4 weeks post op. Follow up in office 2 weeks.    Ryin Schillo, Lar[place]ance Sachs 07/09/2014, 7:48 AM

## 2014-07-10 NOTE — Care Management Note (Signed)
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 Patient underwent a left total hip arthroplasty. Patient is deaf and mute. communicates through writing and a sign interpreter.     Action/Plan:   Patient is for shortterm rehab at [facility]. Social worker is working on placement.   Anticipated DC Date:  07/10/2014   Anticipated DC Plan:  SKILLED NURSING FACILITY  In-house referral  Clinical Social Worker      DC Planning Services  CM consult      [facility] Choice  NA   Choice offered to / List presented to:     DME arranged  NA        [place] arranged  NA      Status of service:  Completed, signed off Medicare Important Message given?   (If response is "NO", the following Medicare IM given date fields will be blank) Date Medicare IM given:   Medicare IM given by:   Date Additional Medicare IM given:   Additional Medicare IM given by:    Discharge Disposition:  [place]  Per UR Regulation:  Reviewed for med. necessity/level of care/duration of stay

## 2014-07-10 NOTE — Progress Notes (Signed)
Subjective: 2 Days Post-Op Procedure(s) (LRB): RIGHT  TOTAL HIP ARTHROPLASTY ANTERIOR APPROACH (Right)  Patient resting comfortably in bed. He is ready to go to [facility] today but family states that no bed is available.  Activity level:  wbat Diet tolerance:  ok Voiding:  ok Patient reports pain as mild.    Objective: Vital signs in last 24 hours: Temp:  [97.7 F (36.5 C)-99.1 F (37.3 C)] 97.7 F (36.5 C) (04/28 1330) Pulse Rate:  [76-94] 94 (04/28 1330) Resp:  [16-18] 18 (04/28 1330) BP: (120-137)/(69-80) 125/69 mmHg (04/28 1330) SpO2:  [96 %-97 %] 97 % (04/28 1330)  Labs: No results for input(s): HGB in the last 72 hours. No results for input(s): WBC, RBC, HCT, PLT in the last 72 hours. No results for input(s): NA, K, CL, CO2, BUN, CREATININE, GLUCOSE, CALCIUM in the last 72 hours. No results for input(s): LABPT, INR in the last 72 hours.  Physical Exam:  Neurologically intact ABD soft Neurovascular intact Sensation intact distally Intact pulses distally Dorsiflexion/Plantar flexion intact Incision: dressing C/D/I, no drainage, scant drainage and moderate drainage No cellulitis present Compartment soft  Assessment/Plan:  2 Days Post-Op Procedure(s) (LRB): RIGHT  TOTAL HIP ARTHROPLASTY ANTERIOR APPROACH (Right) Advance diet Up with therapy Plan for discharge tomorrow Discharge to SNF [facility] if bed available. If no bed available we discussed [facility] or [facility]. Continue on ASA 325mg  BID x 4 weeks. Follow up in office 2 weeks post op.   Guillaume Weninger, Larwance Sachs 07/10/2014, 2:51 PM

## 2014-07-10 NOTE — Clinical Social Work Note (Signed)
Clinical Social Work Assessment  Patient Details  Name: Zachary Lopez MRN: [id number] Date of Birth: 09/01/1955  Date of referral:  07/10/14               Reason for consult:  Facility Placement, Discharge Planning                Permission sought to share information with:  Family Supports Permission granted to share information::  Yes, Verbal Permission Granted  Name::     Winona Legato  Agency::     Relationship::  Sister  Contact Information:  [phone]  Housing/Transportation Living arrangements for the past 2 months:  [place] of Information:  Other (Comment Required) (Patient's sister, [name].) Patient Interpreter Needed:  Sign Language (Patient deaf/mute, but can communicate by writing.) Criminal Activity/Legal Involvement Pertinent to Current Situation/Hospitalization:  No - Comment as needed (Not appropriate at this time.) Significant Relationships:  Siblings, Other Family Members Lives with:  Self Do you feel safe going back to the place where you live?  Yes Need for family participation in patient care:  Yes (Comment) (Patient's sister involved in patient's care.)  Care giving concerns:  Patient's family active in patient's care and discharge planning. Patient's sister requesting SNF placement in either [facility] or [facility]. Patient's family expressed no concerns at this time regarding patient's care or discharge plan.   Social Worker assessment / plan:  CSW spoke with patient's sister, [name], regarding discharge disposition. Per patient's sister, patient has previously completed short-term rehabilitation at [facility] and family would prefer for patient to readmit at discharge. Patient's sister agreeable to SNF search in [facility] as an alternative to [facility].   Employment status:  Other (Comment) (Patient's sister did not comment on patient's employment status.) Insurance information:  Managed Care Environmental education officer) PT  Recommendations:  [place] / Referral to community resources:  [place]  Patient/Family's Response to care:  Patient's family understanding and agreeable to CSW plan of care.  Patient/Family's Understanding of and Emotional Response to Diagnosis, Current Treatment, and Prognosis:  Patient's family understanding and agreeable to CSW plan of care.  Emotional Assessment Appearance:  Appears stated age Attitude/Demeanor/Rapport:  Other (CSW completed assessment with patient's sister at patient's request.) Affect (typically observed):  Other (CSW completed assessment with patient's sister at patient's request.) Orientation:  Oriented to Self, Oriented to Place, Oriented to  Time, Oriented to Situation Alcohol / Substance use:  Not Applicable Psych involvement (Current and /or in the community):  No (Comment) (Not appropriate on this admission.)  Discharge Needs  Concerns to be addressed:  No discharge needs identified Readmission within the last 30 days:  No Current discharge risk:  None Barriers to Discharge:  No Barriers Identified   Caroline Sauger, LCSW 07/10/2014, 2:28 PM [phone]

## 2014-07-10 NOTE — Progress Notes (Signed)
Physical Therapy Treatment Patient Details Name: Zachary Lopez MRN: [id number] DOB: 1956/02/03 Today's Date: 07/10/2014    History of Present Illness Patient is a 59 y/o male s/p R THA. PMH of HLD.     PT Comments    Pt was seen with interpreter to assist the visit, having covered all concerns for gait and HEP.  Pt is comfortable with all and anticipating dc to SNF in [place], where his sisters live.  PT is going well and will not anticipate problems in the transition to SNF, which is still appropriate.  Follow Up Recommendations  SNF;Supervision/Assistance - 24 hour     Equipment Recommendations  None recommended by PT    Recommendations for Other Services       Precautions / Restrictions Precautions Precautions: None Precaution Comments: Direct anterior approach. Restrictions Weight Bearing Restrictions: Yes RLE Weight Bearing: Weight bearing as tolerated    Mobility  Bed Mobility               General bed mobility comments: up when PT arrived   Transfers Overall transfer level: Needs assistance Equipment used: Rolling walker (2 wheeled) Transfers: Sit to/from [facility] Sit to Stand: Min guard Stand pivot transfers: Min guard       General transfer comment: Safety with monitoring  Ambulation/Gait Ambulation/Gait assistance: Min guard Ambulation Distance (Feet): 150 Feet Assistive device: Rolling walker (2 wheeled) Gait Pattern/deviations: Step-through pattern;Wide base of support;Trunk flexed;Shuffle;Decreased weight shift to right Gait velocity: reduced Gait velocity interpretation: Below normal speed for age/gender General Gait Details: Reminders for posture and to align with walker   Stairs Stairs:  (declined as he is in level entrance home)          Wheelchair Mobility    Modified Rankin (Stroke Patients Only)       Balance Overall balance assessment: Needs assistance Sitting-balance support: Feet supported Sitting  balance-Leahy Scale: Good   Postural control: Posterior lean Standing balance support: Bilateral upper extremity supported Standing balance-Leahy Scale: Fair Standing balance comment: fair- dynamic balance                    Cognition Arousal/Alertness: Awake/alert Behavior During Therapy: WFL for tasks assessed/performed Overall Cognitive Status: Within Functional Limits for tasks assessed                      Exercises Total Joint Exercises Ankle Circles/Pumps: Both;5 reps;AROM Quad Sets: AROM;Both;10 reps Heel Slides: AROM;Both;10 reps Hip ABduction/ADduction: AROM;Both;10 reps Long Arc Quad: AROM;Both;10 reps Knee Flexion: AROM;Both;10 reps    General Comments General comments (skin integrity, edema, etc.): Pt is demonstrating some difficulty with standing initially but then was comfortable, reminders for hand plamcement and to control sitting esp in BR.      Pertinent Vitals/Pain Pain Assessment: 0-10 Pain Score: 5  Pain Intervention(s): Limited activity within patient's tolerance;Monitored during session;Premedicated before session;Repositioned;Ice applied    Home Living                      Prior Function            PT Goals (current goals can now be found in the care plan section) Acute Rehab PT Goals Patient Stated Goal: to go to rehab Progress towards PT goals: Progressing toward goals    Frequency  7X/week    PT Plan Current plan remains appropriate    Co-evaluation             End  of Session Equipment Utilized During Treatment: Gait belt Activity Tolerance: Patient tolerated treatment well Patient left: in chair;with call bell/phone within reach     Time: 1130-1210 PT Time Calculation (min) (ACUTE ONLY): 40 min  Charges:  $Gait Training: 23-37 mins $Therapeutic Exercise: 8-22 mins                    G Codes:      Ramond Dial 07-20-2014, 1:30 PM   Mee Hives, PT MS Acute Rehab Dept. Number: [phone]

## 2014-07-11 MED ORDER — HYDROCODONE-ACETAMINOPHEN 5-325 MG PO TABS: 1.0000 | ORAL_TABLET | ORAL | Status: DC | PRN

## 2014-07-11 MED ORDER — METHOCARBAMOL 500 MG PO TABS: 500.0000 mg | ORAL_TABLET | Freq: Four times a day (QID) | ORAL | Status: DC | PRN

## 2014-07-11 MED ORDER — ASPIRIN 325 MG PO TBEC: 325.0000 mg | DELAYED_RELEASE_TABLET | Freq: Two times a day (BID) | ORAL | Status: DC

## 2014-07-11 NOTE — Care Management Note (Addendum)
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 Patient underwent a left total hip arthroplasty. Patient is deaf and mute. communicates through writing and a sign interpreter.     Action/Plan:   Patient is for shortterm rehab at [facility]. Social worker is working on placement.  See below note   Anticipated DC Date:  07/11/2014   Anticipated DC Plan:  [place] referral  Clinical Social Worker      DC Planning Services  CM consult      [facility] Choice  NA   Choice offered to / List presented to:     DME arranged  3-N-1      DME agency  [place] arranged  [place].   Status of service:  Completed, signed off Medicare Important Message given?   (If response is "NO", the following Medicare IM given date fields will be blank) Date Medicare IM given:   Medicare IM given by:   Date Additional Medicare IM given:   Additional Medicare IM given by:    Discharge Disposition:  [place]  Per UR Regulation:  Reviewed for med. necessity/level of care/duration of stay    Comments:  07/11/14 12:00pm Ricki Miller, RN BSN Case Manager Case Manager spoke with Patient's sister- Lazaro Arms -[phone] - concerning patient's discharge plan. Patient will go to her home: [address] [place], [place] [address].  Referral was called to [place] , [facility] Liaison. Informed her that patient is Deaf and mute, communicates via writing on note pad. Patient has a rolling walker at home, Case manager has requested a 3in1 from [place].

## 2014-07-11 NOTE — Progress Notes (Signed)
PT Cancellation Note  Patient Details Name: Zachary Lopez MRN: [id number] DOB: 05/24/1955   Cancelled Treatment:    Reason Eval/Treat Not Completed: Patient declined, no reason specified.  Pt sitting EOB with belongings together waiting for sister to arrive to take him to her house.  Called sister and she said it is a 1 level home with no steps to enter.  Offered ambulation and/or therex and pt declined.  Gave illustrated HEP for home.  Recommend HHPT now that pt is no longer going to SNF.   Kaden Dunkel LUBECK 07/11/2014, 1:21 PM

## 2014-07-11 NOTE — Progress Notes (Signed)
Let pt read discharge instruction and ask if he has questions. Pt has no questions and waiting for her sister to be here to take him home.

## 2014-07-11 NOTE — Clinical Social Work Note (Signed)
CSW spoke with patient's sister, [name], regarding discharge disposition. Patient's sister requesting patient to discharge to patient's sister home with home health services. RNCM updated regarding change in discharge disposition. CSW signing off.  Lubertha Sayres, [place] Cell: [phone]       Fax: [phone] Clinical Social Work: Orthopedics [phone]) and Surgical [phone])

## 2014-07-11 NOTE — Discharge Summary (Signed)
Patient ID: Zachary Lopez MRN: [id number] DOB/AGE: June 08, 1955 59 y.o.  Admit date: 07/08/2014 Discharge date: 07/11/2014  Admission Diagnoses:  Principal Problem:   Primary osteoarthritis of right hip   Discharge Diagnoses:  Same  Past Medical History  Diagnosis Date  . Complication of anesthesia   . PONV (postoperative nausea and vomiting)   . Hyperlipemia   . Arthritis     Surgeries: Procedure(s): RIGHT  TOTAL HIP ARTHROPLASTY ANTERIOR APPROACH on 07/08/2014   Consultants:    Discharged Condition: Improved  Hospital Course: Zachary Lopez is an 59 y.o. male who was admitted 07/08/2014 for operative treatment ofPrimary osteoarthritis of right hip. Patient has severe unremitting pain that affects sleep, daily activities, and work/hobbies. After pre-op clearance the patient was taken to the operating room on 07/08/2014 and underwent  Procedure(s): RIGHT  TOTAL HIP ARTHROPLASTY ANTERIOR APPROACH.    Patient was given perioperative antibiotics: Anti-infectives    Start     Dose/Rate Route Frequency Ordered Stop   07/08/14 1830  ceFAZolin (ANCEF) IVPB 2 g/50 mL premix     2 g 100 mL/hr over 30 Minutes Intravenous Every 6 hours 07/08/14 1803 07/09/14 0027   07/08/14 0600  ceFAZolin (ANCEF) IVPB 2 g/50 mL premix     2 g 100 mL/hr over 30 Minutes Intravenous On call to O.R. 07/07/14 1416 07/08/14 1212       Patient was given sequential compression devices, early ambulation, and chemoprophylaxis to prevent DVT.  Patient benefited maximally from hospital stay and there were no complications.    Recent vital signs: Patient Vitals for the past 24 hrs:  BP Temp Temp src Pulse Resp SpO2  07/11/14 0443 115/73 mmHg 98.3 F (36.8 C) Oral 86 17 97 %  07/11/14 0000 - - - - 17 -  07/10/14 2036 135/83 mmHg 99.1 F (37.3 C) Oral 98 17 97 %  07/10/14 2000 - - - - 16 -  07/10/14 1330 125/69 mmHg 97.7 F (36.5 C) Oral 94 18 97 %     Recent laboratory studies: No results for input(s): WBC,  HGB, HCT, PLT, NA, K, CL, CO2, BUN, CREATININE, GLUCOSE, INR, CALCIUM in the last 72 hours.  Invalid input(s): PT, 2   Discharge Medications:     Medication List    TAKE these medications        acetaminophen 325 MG tablet  Commonly known as:  TYLENOL  Take 650 mg by mouth every 6 (six) hours as needed (pain).     aspirin 325 MG EC tablet  Take 1 tablet (325 mg total) by mouth 2 (two) times daily after a meal.     CRESTOR 10 MG tablet  Generic drug:  rosuvastatin  Take 10 mg by mouth 2 (two) times daily.     HYDROcodone-acetaminophen 5-325 MG per tablet  Commonly known as:  NORCO/VICODIN  Take 1-2 tablets by mouth every 4 (four) hours as needed (breakthrough pain).     methocarbamol 500 MG tablet  Commonly known as:  ROBAXIN  Take 1 tablet (500 mg total) by mouth every 6 (six) hours as needed for muscle spasms.        Diagnostic Studies: Dg Chest 2 View  06/27/2014   CLINICAL DATA:  Preop left total hip arthroplasty. No chest complaints. Nonsmoker.  EXAM: CHEST  2 VIEW  COMPARISON:  None  FINDINGS: There is mild anterior eventration of the right hemidiaphragm. Cardiomediastinal silhouette is within normal limits. There is no evidence of airspace consolidation, edema, pleural effusion, or  pneumothorax. No acute osseous abnormality is identified.  IMPRESSION: No active cardiopulmonary disease.   Electronically Signed   By: Logan Bores   On: 06/27/2014 14:36   Dg Hip Operative Unilat With Pelvis Right  07/08/2014   CLINICAL DATA:  Right hip replacement.  EXAM: OPERATIVE RIGHT HIP (WITH PELVIS IF PERFORMED) 2 VIEWS  : COMPARISON:  None.  FINDINGS: We are provided with 2 fluoroscopic spot views of the low pelvis and right hip. Images demonstrate a total arthroplasty in place. The device is located and no fracture is identified.  IMPRESSION: Right hip replacement without evidence of acute abnormality.   Electronically Signed   By: Inge Rise M.D.   On: 07/08/2014 15:33     Disposition: Final discharge disposition not confirmed      Discharge Instructions    Call MD / Call 911    Complete by:  As directed   If you experience chest pain or shortness of breath, CALL 911 and be transported to the hospital emergency room.  If you develope a fever above 101 F, pus (white drainage) or increased drainage or redness at the wound, or calf pain, call your surgeon's office.     Constipation Prevention    Complete by:  As directed   Drink plenty of fluids.  Prune juice may be helpful.  You may use a stool softener, such as Colace (over the counter) 100 mg twice a day.  Use MiraLax (over the counter) for constipation as needed.     Diet - low sodium heart healthy    Complete by:  As directed      Discharge instructions    Complete by:  As directed   INSTRUCTIONS AFTER JOINT REPLACEMENT   Remove items at home which could result in a fall. This includes throw rugs or furniture in walking pathways ICE to the affected joint every three hours while awake for 30 minutes at a time, for at least the first 3-5 days, and then as needed for pain and swelling.  Continue to use ice for pain and swelling. You may notice swelling that will progress down to the foot and ankle.  This is normal after surgery.  Elevate your leg when you are not up walking on it.   Continue to use the breathing machine you got in the hospital (incentive spirometer) which will help keep your temperature down.  It is common for your temperature to cycle up and down following surgery, especially at night when you are not up moving around and exerting yourself.  The breathing machine keeps your lungs expanded and your temperature down.   DIET:  As you were doing prior to hospitalization, we recommend a well-balanced diet.  DRESSING / WOUND CARE / SHOWERING  Keep dressing clean and dry until follow up.  ACTIVITY  Increase activity slowly as tolerated, but follow the weight bearing instructions below.    No driving for 6 weeks or until further direction given by your physician.  You cannot drive while taking narcotics.  No lifting or carrying greater than 10 lbs. until further directed by your surgeon. Avoid periods of inactivity such as sitting longer than an hour when not asleep. This helps prevent blood clots.  You may return to work once you are authorized by your doctor.     WEIGHT BEARING   Weight bearing as tolerated with assist device (walker, cane, etc) as directed, use it as long as suggested by your surgeon or therapist, typically at least  4-6 weeks.   EXERCISES  Results after joint replacement surgery are often greatly improved when you follow the exercise, range of motion and muscle strengthening exercises prescribed by your doctor. Safety measures are also important to protect the joint from further injury. Any time any of these exercises cause you to have increased pain or swelling, decrease what you are doing until you are comfortable again and then slowly increase them. If you have problems or questions, call your caregiver or physical therapist for advice.   Rehabilitation is important following a joint replacement. After just a few days of immobilization, the muscles of the leg can become weakened and shrink (atrophy).  These exercises are designed to build up the tone and strength of the thigh and leg muscles and to improve motion. Often times heat used for twenty to thirty minutes before working out will loosen up your tissues and help with improving the range of motion but do not use heat for the first two weeks following surgery (sometimes heat can increase post-operative swelling).   These exercises can be done on a training (exercise) mat, on the floor, on a table or on a bed. Use whatever works the best and is most comfortable for you.    Use music or television while you are exercising so that the exercises are a pleasant break in your day. This will make your life  better with the exercises acting as a break in your routine that you can look forward to.   Perform all exercises about fifteen times, three times per day or as directed.  You should exercise both the operative leg and the other leg as well.   Exercises include:   Quad Sets - Tighten up the muscle on the front of the thigh (Quad) and hold for 5-10 seconds.   Straight Leg Raises - With your knee straight (if you were given a brace, keep it on), lift the leg to 60 degrees, hold for 3 seconds, and slowly lower the leg.  Perform this exercise against resistance later as your leg gets stronger.  Leg Slides: Lying on your back, slowly slide your foot toward your buttocks, bending your knee up off the floor (only go as far as is comfortable). Then slowly slide your foot back down until your leg is flat on the floor again.  Angel Wings: Lying on your back spread your legs to the side as far apart as you can without causing discomfort.  Hamstring Strength:  Lying on your back, push your heel against the floor with your leg straight by tightening up the muscles of your buttocks.  Repeat, but this time bend your knee to a comfortable angle, and push your heel against the floor.  You may put a pillow under the heel to make it more comfortable if necessary.   A rehabilitation program following joint replacement surgery can speed recovery and prevent re-injury in the future due to weakened muscles. Contact your doctor or a physical therapist for more information on knee rehabilitation.    CONSTIPATION  Constipation is defined medically as fewer than three stools per week and severe constipation as less than one stool per week.  Even if you have a regular bowel pattern at home, your normal regimen is likely to be disrupted due to multiple reasons following surgery.  Combination of anesthesia, postoperative narcotics, change in appetite and fluid intake all can affect your bowels.   YOU MUST use at least one of the  following options; they  are listed in order of increasing strength to get the job done.  They are all available over the counter, and you may need to use some, POSSIBLY even all of these options:    Drink plenty of fluids (prune juice may be helpful) and high fiber foods Colace 100 mg by mouth twice a day  Senokot for constipation as directed and as needed Dulcolax (bisacodyl), take with full glass of water  Miralax (polyethylene glycol) once or twice a day as needed.  If you have tried all these things and are unable to have a bowel movement in the first 3-4 days after surgery call either your surgeon or your primary doctor.    If you experience loose stools or diarrhea, hold the medications until you stool forms back up.  If your symptoms do not get better within 1 week or if they get worse, check with your doctor.  If you experience "the worst abdominal pain ever" or develop nausea or vomiting, please contact the office immediately for further recommendations for treatment.   ITCHING:  If you experience itching with your medications, try taking only a single pain pill, or even half a pain pill at a time.  You can also use Benadryl over the counter for itching or also to help with sleep.   TED HOSE STOCKINGS:  Use stockings on both legs until for at least 2 weeks or as directed by physician office. They may be removed at night for sleeping.  MEDICATIONS:  See your medication summary on the "After Visit Summary" that nursing will review with you.  You may have some home medications which will be placed on hold until you complete the course of blood thinner medication.  It is important for you to complete the blood thinner medication as prescribed.  PRECAUTIONS:  If you experience chest pain or shortness of breath - call 911 immediately for transfer to the hospital emergency department.   If you develop a fever greater that 101 F, purulent drainage from wound, increased redness or drainage from  wound, foul odor from the wound/dressing, or calf pain - CONTACT YOUR SURGEON.                                                   FOLLOW-UP APPOINTMENTS:  If you do not already have a post-op appointment, please call the office for an appointment to be seen by your surgeon.  Guidelines for how soon to be seen are listed in your "After Visit Summary", but are typically between 1-4 weeks after surgery.  OTHER INSTRUCTIONS:   Knee Replacement:  Do not place pillow under knee, focus on keeping the knee straight while resting. CPM instructions: 0-90 degrees, 2 hours in the morning, 2 hours in the afternoon, and 2 hours in the evening. Place foam block, curve side up under heel at all times except when in CPM or when walking.  DO NOT modify, tear, cut, or change the foam block in any way.  MAKE SURE YOU:  Understand these instructions.  Get help right away if you are not doing well or get worse.    Thank you for letting us be a part of your medical care team.  It is a privilege we respect greatly.  We hope these instructions will help you stay on track for a fast and full recovery!  Increase activity slowly as tolerated    Complete by:  As directed            Follow-up Information    Follow up with Hessie Dibble, MD In 2 weeks.   Specialty:  Orthopedic Surgery   Contact information:   [place] [address] [phone]        Signed: Rich Fuchs 07/11/2014, 7:57 AM

## 2014-07-11 NOTE — Progress Notes (Signed)
Subjective: 3 Days Post-Op Procedure(s) (LRB): RIGHT  TOTAL HIP ARTHROPLASTY ANTERIOR APPROACH (Right)  Activity level:  wbat Diet tolerance:  ok Voiding:  ok Patient reports pain as mild.    Objective: Vital signs in last 24 hours: Temp:  [97.7 F (36.5 C)-99.1 F (37.3 C)] 98.3 F (36.8 C) (04/29 0443) Pulse Rate:  [86-98] 86 (04/29 0443) Resp:  [16-18] 17 (04/29 0443) BP: (115-135)/(69-83) 115/73 mmHg (04/29 0443) SpO2:  [97 %] 97 % (04/29 0443)  Labs: No results for input(s): HGB in the last 72 hours. No results for input(s): WBC, RBC, HCT, PLT in the last 72 hours. No results for input(s): NA, K, CL, CO2, BUN, CREATININE, GLUCOSE, CALCIUM in the last 72 hours. No results for input(s): LABPT, INR in the last 72 hours.  Physical Exam:  Neurologically intact ABD soft Neurovascular intact Sensation intact distally Intact pulses distally Dorsiflexion/Plantar flexion intact Incision: dressing C/D/I No cellulitis present Compartment soft  Assessment/Plan:  3 Days Post-Op Procedure(s) (LRB): RIGHT  TOTAL HIP ARTHROPLASTY ANTERIOR APPROACH (Right) Advance diet Up with therapy Discharge to SNF [facility] today if bed available if not maybe [facility] or [facility]. Continue on ASA 325mg  BID x 4 weeks. Follow up in office 2 weeks post op.    Margel Joens, Larwance Sachs 07/11/2014, 7:53 AM

## 2016-01-28 ENCOUNTER — Ambulatory Visit (INDEPENDENT_AMBULATORY_CARE_PROVIDER_SITE_OTHER): Payer: PRIVATE HEALTH INSURANCE | Admitting: Sports Medicine

## 2016-01-28 ENCOUNTER — Encounter: Payer: Self-pay | Admitting: Sports Medicine

## 2016-01-28 DIAGNOSIS — B351 Tinea unguium: Secondary | ICD-10-CM | POA: Diagnosis not present

## 2016-01-28 DIAGNOSIS — E785 Hyperlipidemia, unspecified: Secondary | ICD-10-CM

## 2016-01-28 DIAGNOSIS — M79674 Pain in right toe(s): Secondary | ICD-10-CM

## 2016-01-28 DIAGNOSIS — H9193 Unspecified hearing loss, bilateral: Secondary | ICD-10-CM

## 2016-01-28 DIAGNOSIS — M79675 Pain in left toe(s): Secondary | ICD-10-CM

## 2016-01-28 DIAGNOSIS — I739 Peripheral vascular disease, unspecified: Secondary | ICD-10-CM

## 2016-01-28 DIAGNOSIS — H9190 Unspecified hearing loss, unspecified ear: Secondary | ICD-10-CM | POA: Insufficient documentation

## 2016-01-28 HISTORY — DX: Hyperlipidemia, unspecified: E78.5

## 2016-01-28 HISTORY — DX: Unspecified hearing loss, bilateral: H91.93

## 2016-01-28 NOTE — Progress Notes (Signed)
Subjective: Zachary Lopez is a 60 y.o. male patient seen today in office with complaint of painful thickened and elongated toenails; unable to trim.  Patient denies history of Diabetes, Neuropathy, or Vascular disease. Patient has no other pedal complaints at this time.   Patient is assisted by sister who is translating since brother is deaf and reports that he use to work for city and city nurse said he may need nail removed.  Patient Active Problem List   Diagnosis Date Noted  . Acquired deafness 01/28/2016  . Hyperlipidemia 01/28/2016  . Primary osteoarthritis of right hip 07/08/2014  . Contracture of knee joint 12/21/2011  . Knee pain 12/21/2011  . 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 acetaminophen (TYLENOL) 325 MG tablet Take 650 mg by mouth every 6 (six) hours as needed (pain).    Marland Kitchen aspirin EC 325 MG EC tablet Take 1 tablet (325 mg total) by mouth 2 (two) times daily after a meal. 60 tablet 0  . CRESTOR 10 MG tablet Take 10 mg by mouth 2 (two) times daily.  1  . HYDROcodone-acetaminophen (NORCO/VICODIN) 5-325 MG per tablet Take 1-2 tablets by mouth every 4 (four) hours as needed (breakthrough pain). 50 tablet 0  . methocarbamol (ROBAXIN) 500 MG tablet Take 1 tablet (500 mg total) by mouth every 6 (six) hours as needed for muscle spasms. 50 tablet 0   No current facility-administered medications on file prior to visit.     Allergies  Allergen Reactions  . Cefepime Rash and Hives  . Vancomycin Anaphylaxis    Objective: Physical Exam  General: Well developed, nourished, no acute distress, awake, alert and oriented x 3  Vascular: Dorsalis pedis artery 1/4 bilateral, Posterior tibial artery 1/4 bilateral, skin temperature warm to warm proximal to distal bilateral lower extremities, + varicosities, scant pedal hair present bilateral.  Neurological: Gross sensation  present via light touch bilateral.   Dermatological: Skin is warm, dry, and supple bilateral, Nails 1-10 are tender, long, thick, and discolored with mild subungal debris especially the left hallux nail, no webspace macerations present bilateral, no open lesions present bilateral, no callus/corns/hyperkeratotic tissue present bilateral. No signs of infection bilateral.  Musculoskeletal: No symptomatic boney deformities noted bilateral. Muscular strength within normal limits without painon range of motion. No pain with calf compression bilateral.  Assessment and Plan:  Problem List Items Addressed This Visit    None    Visit Diagnoses    Dermatophytosis of nail    -  Primary   Toe pain, bilateral       PVD (peripheral vascular disease) ([facility])       Relevant Medications   atorvastatin (LIPITOR) 10 MG tablet   niacin (NIASPAN) 500 MG CR tablet   Bilateral deafness          -Examined patient.  -Discussed treatment options for painful mycotic nails. -Explained possibility of slow healing if we remove the left big toenail; patient and sister opt for nail trim today and if continues to be bothersome may consider procedure -Mechanically debrided and reduced mycotic nails with sterile nail nipper and dremel nail file without incident. -Patient to return in 3 months for follow up evaluation or sooner if symptoms worsen.  Landis Martins, DPM

## 2016-03-11 IMAGING — RF DG HIP (WITH PELVIS) OPERATIVE*R*
1 series · 2 of 2 positions shown · non-contrast
Comparison: None.

CLINICAL DATA: Right hip replacement.

EXAM:
OPERATIVE RIGHT HIP (WITH PELVIS IF PERFORMED) 2 VIEWS
:

[Series 1: run · 2 of 2 slices shown]
[im 1/2]
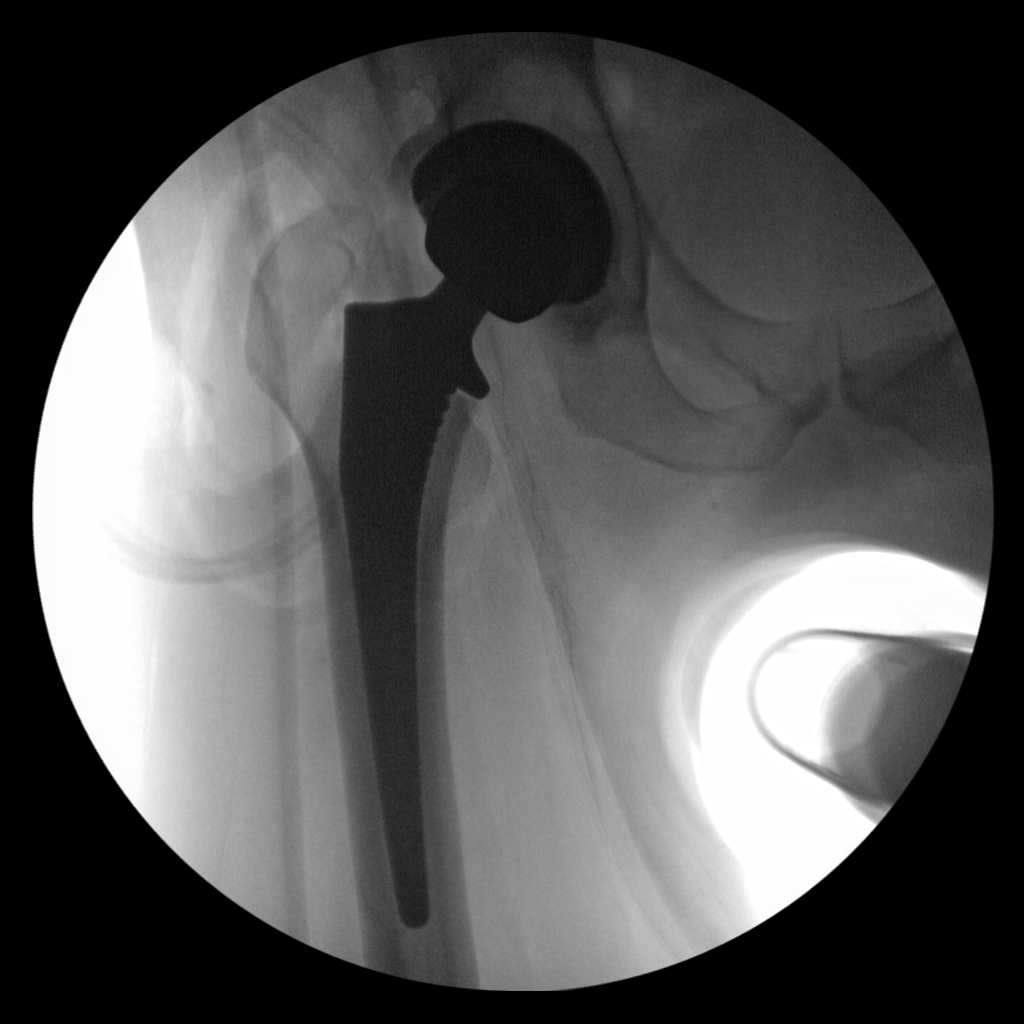
[im 2/2]
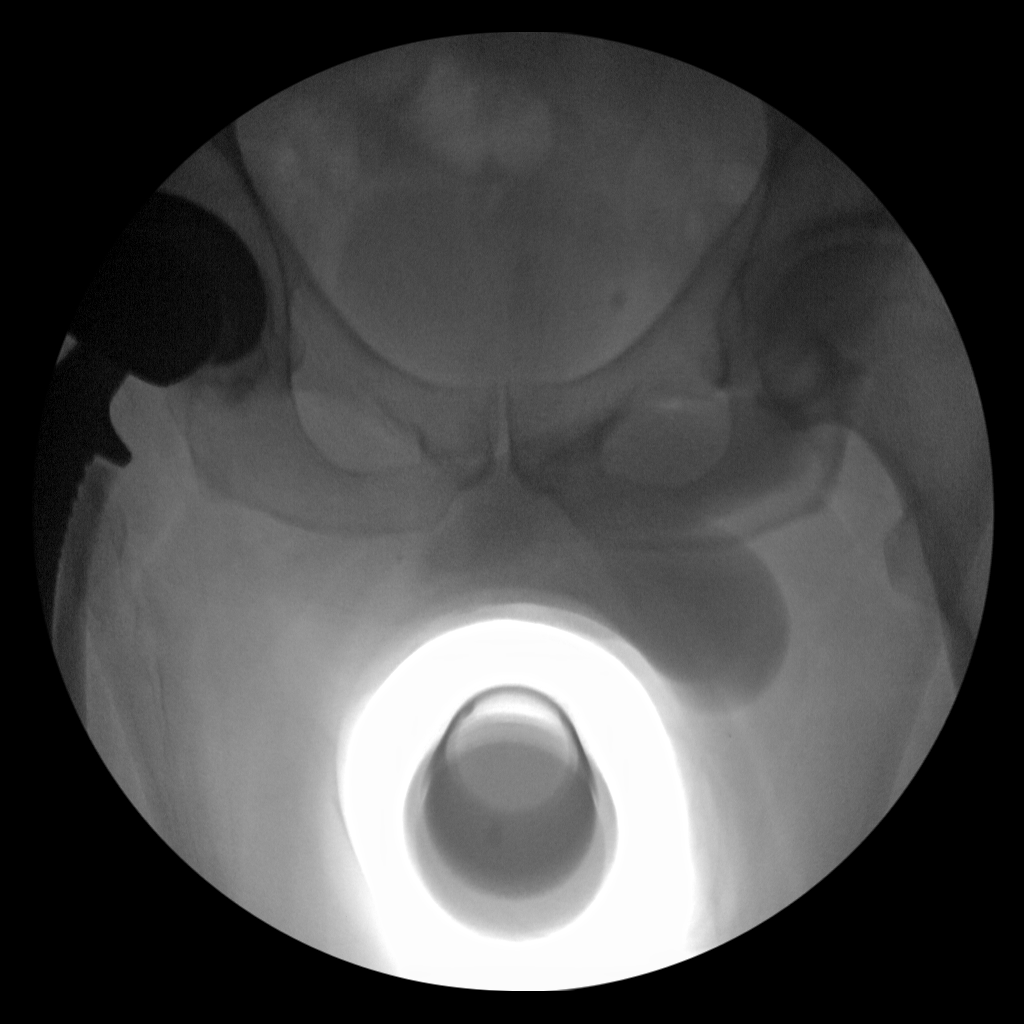

[2 of 2 positions shown; findings below may reference images not displayed]

FINDINGS: We are provided with 2 fluoroscopic spot views of the low pelvis and
right hip. Images demonstrate a total arthroplasty in place. The
device is located and no fracture is identified.
IMPRESSION: Right hip replacement without evidence of acute abnormality.

## 2016-03-21 DIAGNOSIS — R079 Chest pain, unspecified: Secondary | ICD-10-CM | POA: Diagnosis not present

## 2016-03-21 DIAGNOSIS — H905 Unspecified sensorineural hearing loss: Secondary | ICD-10-CM | POA: Diagnosis not present

## 2016-03-21 DIAGNOSIS — E785 Hyperlipidemia, unspecified: Secondary | ICD-10-CM | POA: Diagnosis not present

## 2016-03-21 DIAGNOSIS — R739 Hyperglycemia, unspecified: Secondary | ICD-10-CM | POA: Diagnosis not present

## 2016-03-22 DIAGNOSIS — H905 Unspecified sensorineural hearing loss: Secondary | ICD-10-CM | POA: Diagnosis not present

## 2016-03-22 DIAGNOSIS — E785 Hyperlipidemia, unspecified: Secondary | ICD-10-CM | POA: Diagnosis not present

## 2016-03-22 DIAGNOSIS — R079 Chest pain, unspecified: Secondary | ICD-10-CM | POA: Diagnosis not present

## 2016-03-22 DIAGNOSIS — R739 Hyperglycemia, unspecified: Secondary | ICD-10-CM | POA: Diagnosis not present

## 2016-03-29 DIAGNOSIS — I10 Essential (primary) hypertension: Secondary | ICD-10-CM | POA: Insufficient documentation

## 2016-03-29 DIAGNOSIS — Z79899 Other long term (current) drug therapy: Secondary | ICD-10-CM

## 2016-03-29 DIAGNOSIS — R7303 Prediabetes: Secondary | ICD-10-CM

## 2016-03-29 HISTORY — DX: Other long term (current) drug therapy: Z79.899

## 2016-03-29 HISTORY — DX: Essential (primary) hypertension: I10

## 2016-03-29 HISTORY — DX: Prediabetes: R73.03

## 2016-04-29 ENCOUNTER — Ambulatory Visit: Payer: PRIVATE HEALTH INSURANCE | Admitting: Sports Medicine

## 2016-04-29 ENCOUNTER — Encounter: Payer: Self-pay | Admitting: Sports Medicine

## 2016-04-29 DIAGNOSIS — I739 Peripheral vascular disease, unspecified: Secondary | ICD-10-CM | POA: Diagnosis not present

## 2016-04-29 DIAGNOSIS — M79674 Pain in right toe(s): Secondary | ICD-10-CM | POA: Diagnosis not present

## 2016-04-29 DIAGNOSIS — M79675 Pain in left toe(s): Secondary | ICD-10-CM

## 2016-04-29 DIAGNOSIS — B351 Tinea unguium: Secondary | ICD-10-CM

## 2016-04-29 NOTE — Progress Notes (Signed)
Subjective: Zachary Lopez is a 60 y.o. male patient seen today in office with complaint of painful thickened and elongated toenails; unable to trim.  Patient denies history of Diabetes, Neuropathy, or Vascular disease. Patient has no other pedal complaints at this time.   Patient is deaf.   Patient Active Problem List   Diagnosis Date Noted  . Acquired deafness 01/28/2016  . Hyperlipidemia 01/28/2016  . Primary osteoarthritis of right hip 07/08/2014  . Contracture of knee joint 12/21/2011  . Knee pain 12/21/2011  . 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 acetaminophen (TYLENOL) 325 MG tablet Take 650 mg by mouth every 6 (six) hours as needed (pain).    Marland Kitchen aspirin EC 325 MG EC tablet Take 1 tablet (325 mg total) by mouth 2 (two) times daily after a meal. 60 tablet 0  . atorvastatin (LIPITOR) 10 MG tablet Take 10 mg by mouth.    . CRESTOR 10 MG tablet Take 10 mg by mouth 2 (two) times daily.  1  . cyclobenzaprine (FLEXERIL) 10 MG tablet Take 10 mg by mouth.    . doxycycline (VIBRAMYCIN) 100 MG capsule Take 100 mg by mouth.    Marland Kitchen HYDROcodone-acetaminophen (NORCO/VICODIN) 5-325 MG per tablet Take 1-2 tablets by mouth every 4 (four) hours as needed (breakthrough pain). 50 tablet 0  . ibuprofen (ADVIL,MOTRIN) 800 MG tablet TAKE 1 TABLET (800 MG TOTAL) BY MOUTH EVERY 8 (EIGHT) HOURS AS NEEDED FOR PAIN.    . meloxicam (MOBIC) 15 MG tablet TAKE ONE TABLET BY MOUTH EVERY DAY AFTER MEAL    . methocarbamol (ROBAXIN) 500 MG tablet Take 1 tablet (500 mg total) by mouth every 6 (six) hours as needed for muscle spasms. 50 tablet 0  . niacin (NIASPAN) 500 MG CR tablet Take 500 mg by mouth.    . rifampin (RIFADIN) 300 MG capsule Take 300 mg by mouth.     No current facility-administered medications on file prior to visit.     Allergies  Allergen Reactions  . Cefepime Rash and Hives  .  Vancomycin Anaphylaxis    Objective: Physical Exam  General: Well developed, nourished, no acute distress, awake, alert and oriented x 3  Vascular: Dorsalis pedis artery 1/4 bilateral, Posterior tibial artery 1/4 bilateral, skin temperature warm to warm proximal to distal bilateral lower extremities, + varicosities, scant pedal hair present bilateral.  Neurological: Gross sensation present via light touch bilateral.   Dermatological: Skin is warm, dry, and supple bilateral, Nails 1-10 are tender, long, thick, and discolored with mild subungal debris especially the left hallux nail, no webspace macerations present bilateral, no open lesions present bilateral, no callus/corns/hyperkeratotic tissue present bilateral. No signs of infection bilateral.  Musculoskeletal: No symptomatic boney deformities noted bilateral. Muscular strength within normal limits without painon range of motion. No pain with calf compression bilateral.  Assessment and Plan:  Problem List Items Addressed This Visit    None    Visit Diagnoses    Dermatophytosis of nail    -  Primary   Toe pain, bilateral       PVD (peripheral vascular disease) ([place])          -Examined patient.  -Discussed treatment options for painful mycotic nails. -Mechanically debrided and reduced mycotic nails with sterile nail nipper and dremel nail file without incident. -Patient to return in 3 months for follow up evaluation or sooner if symptoms  worsen.  Landis Martins, DPM

## 2016-07-29 ENCOUNTER — Ambulatory Visit: Payer: PRIVATE HEALTH INSURANCE | Admitting: Sports Medicine

## 2016-08-26 ENCOUNTER — Ambulatory Visit (INDEPENDENT_AMBULATORY_CARE_PROVIDER_SITE_OTHER): Payer: PRIVATE HEALTH INSURANCE | Admitting: Sports Medicine

## 2016-08-26 DIAGNOSIS — M79675 Pain in left toe(s): Secondary | ICD-10-CM | POA: Diagnosis not present

## 2016-08-26 DIAGNOSIS — I739 Peripheral vascular disease, unspecified: Secondary | ICD-10-CM

## 2016-08-26 DIAGNOSIS — M79674 Pain in right toe(s): Secondary | ICD-10-CM

## 2016-08-26 DIAGNOSIS — H9193 Unspecified hearing loss, bilateral: Secondary | ICD-10-CM

## 2016-08-26 DIAGNOSIS — B351 Tinea unguium: Secondary | ICD-10-CM

## 2016-08-26 NOTE — Progress Notes (Signed)
Subjective: Zachary Lopez is a 61 y.o. male patient seen today in office with complaint of painful thickened and elongated toenails; unable to trim.  Patient has no other pedal complaints at this time.   Patient is deaf and does not need interpretor, prefers to come to visits alone.   Patient Active Problem List   Diagnosis Date Noted  . Acquired deafness 01/28/2016  . Hyperlipidemia 01/28/2016  . Primary osteoarthritis of right hip 07/08/2014  . Contracture of knee joint 12/21/2011  . Knee pain 12/21/2011  . 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 acetaminophen (TYLENOL) 325 MG tablet Take 650 mg by mouth every 6 (six) hours as needed (pain).    Marland Kitchen aspirin EC 325 MG EC tablet Take 1 tablet (325 mg total) by mouth 2 (two) times daily after a meal. 60 tablet 0  . atorvastatin (LIPITOR) 10 MG tablet Take 10 mg by mouth.    . CRESTOR 10 MG tablet Take 10 mg by mouth 2 (two) times daily.  1  . cyclobenzaprine (FLEXERIL) 10 MG tablet Take 10 mg by mouth.    . doxycycline (VIBRAMYCIN) 100 MG capsule Take 100 mg by mouth.    Marland Kitchen HYDROcodone-acetaminophen (NORCO/VICODIN) 5-325 MG per tablet Take 1-2 tablets by mouth every 4 (four) hours as needed (breakthrough pain). 50 tablet 0  . ibuprofen (ADVIL,MOTRIN) 800 MG tablet TAKE 1 TABLET (800 MG TOTAL) BY MOUTH EVERY 8 (EIGHT) HOURS AS NEEDED FOR PAIN.    . meloxicam (MOBIC) 15 MG tablet TAKE ONE TABLET BY MOUTH EVERY DAY AFTER MEAL    . methocarbamol (ROBAXIN) 500 MG tablet Take 1 tablet (500 mg total) by mouth every 6 (six) hours as needed for muscle spasms. 50 tablet 0  . niacin (NIASPAN) 500 MG CR tablet Take 500 mg by mouth.    . rifampin (RIFADIN) 300 MG capsule Take 300 mg by mouth.     No current facility-administered medications on file prior to visit.     Allergies  Allergen Reactions  . Cefepime Rash and Hives  . Vancomycin  Anaphylaxis    Objective: Physical Exam  General: Well developed, nourished, no acute distress, awake, alert and orientated   Vascular: Dorsalis pedis artery 1/4 bilateral, Posterior tibial artery 1/4 bilateral, skin temperature warm to warm proximal to distal bilateral lower extremities, + varicosities, scant pedal hair present bilateral.  Neurological: Gross sensation present via light touch bilateral.   Dermatological: Skin is warm, dry, and supple bilateral, Nails 1-10 are tender, long, thick, and discolored with mild subungal debris especially the left hallux nail, no webspace macerations present bilateral, no open lesions present bilateral, no callus/corns/hyperkeratotic tissue present bilateral. No signs of infection bilateral.  Musculoskeletal: No symptomatic boney deformities noted bilateral. Muscular strength within normal limits without painon range of motion. No pain with calf compression bilateral.  Assessment and Plan:  Problem List Items Addressed This Visit    None    Visit Diagnoses    Dermatophytosis of nail    -  Primary   Toe pain, bilateral       PVD (peripheral vascular disease) ([place])       Bilateral deafness         -Examined patient.  -Discussed treatment options for painful mycotic nails. -Mechanically debrided and reduced mycotic nails with sterile nail nipper and dremel nail file without incident. -Patient to return in 3 months  for follow up evaluation or sooner if symptoms worsen.  Landis Martins, DPM

## 2016-11-30 ENCOUNTER — Encounter (INDEPENDENT_AMBULATORY_CARE_PROVIDER_SITE_OTHER): Payer: Self-pay

## 2016-11-30 ENCOUNTER — Ambulatory Visit (INDEPENDENT_AMBULATORY_CARE_PROVIDER_SITE_OTHER): Payer: PRIVATE HEALTH INSURANCE | Admitting: Sports Medicine

## 2016-11-30 DIAGNOSIS — I739 Peripheral vascular disease, unspecified: Secondary | ICD-10-CM | POA: Diagnosis not present

## 2016-11-30 DIAGNOSIS — E119 Type 2 diabetes mellitus without complications: Secondary | ICD-10-CM | POA: Diagnosis not present

## 2016-11-30 DIAGNOSIS — B351 Tinea unguium: Secondary | ICD-10-CM | POA: Diagnosis not present

## 2016-11-30 DIAGNOSIS — M79674 Pain in right toe(s): Secondary | ICD-10-CM

## 2016-11-30 DIAGNOSIS — M79675 Pain in left toe(s): Secondary | ICD-10-CM

## 2016-11-30 DIAGNOSIS — H9193 Unspecified hearing loss, bilateral: Secondary | ICD-10-CM

## 2016-11-30 NOTE — Progress Notes (Signed)
Subjective: Mikail Goostree is a 61 y.o. male patient seen today in office with complaint of painful thickened and elongated toenails; unable to trim. Patient says that he is diabetic now. Patient has no other pedal complaints at this time.   Patient is deaf and does not need interpretor, prefers to come to visits alone.   Patient Active Problem List   Diagnosis Date Noted  . Acquired deafness 01/28/2016  . Hyperlipidemia 01/28/2016  . Primary osteoarthritis of right hip 07/08/2014  . Contracture of knee joint 12/21/2011  . Knee pain 12/21/2011  . 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 acetaminophen (TYLENOL) 325 MG tablet Take 650 mg by mouth every 6 (six) hours as needed (pain).    Marland Kitchen aspirin EC 325 MG EC tablet Take 1 tablet (325 mg total) by mouth 2 (two) times daily after a meal. 60 tablet 0  . atorvastatin (LIPITOR) 10 MG tablet Take 10 mg by mouth.    . CRESTOR 10 MG tablet Take 10 mg by mouth 2 (two) times daily.  1  . cyclobenzaprine (FLEXERIL) 10 MG tablet Take 10 mg by mouth.    Marland Kitchen HYDROcodone-acetaminophen (NORCO/VICODIN) 5-325 MG per tablet Take 1-2 tablets by mouth every 4 (four) hours as needed (breakthrough pain). 50 tablet 0  . ibuprofen (ADVIL,MOTRIN) 800 MG tablet TAKE 1 TABLET (800 MG TOTAL) BY MOUTH EVERY 8 (EIGHT) HOURS AS NEEDED FOR PAIN.    . meloxicam (MOBIC) 15 MG tablet TAKE ONE TABLET BY MOUTH EVERY DAY AFTER MEAL    . methocarbamol (ROBAXIN) 500 MG tablet Take 1 tablet (500 mg total) by mouth every 6 (six) hours as needed for muscle spasms. 50 tablet 0  . niacin (NIASPAN) 500 MG CR tablet Take 500 mg by mouth.    . rifampin (RIFADIN) 300 MG capsule Take 300 mg by mouth.    . doxycycline (VIBRAMYCIN) 100 MG capsule Take 100 mg by mouth.     No current facility-administered medications on file prior to visit.     Allergies  Allergen Reactions  .  Cefepime Rash and Hives  . Vancomycin Anaphylaxis    Objective: Physical Exam  General: Well developed, nourished, no acute distress, awake, alert and orientated   Vascular: Dorsalis pedis artery 1/4 bilateral, Posterior tibial artery 1/4 bilateral, skin temperature warm to warm proximal to distal bilateral lower extremities, + varicosities, scant pedal hair present bilateral.  Neurological: Gross sensation present via light touch bilateral. Protective sensation difficult to test due to patient status.   Dermatological: Skin is warm, dry, and supple bilateral, Nails 1-10 are tender, long, thick, and discolored with mild subungal debris especially the left hallux nail, no webspace macerations present bilateral, no open lesions present bilateral, no callus/corns/hyperkeratotic tissue present bilateral. No signs of infection bilateral.  Musculoskeletal: No symptomatic boney deformities noted bilateral. Muscular strength within normal limits without painon range of motion. No pain with calf compression bilateral.  Assessment and Plan:  Problem List Items Addressed This Visit    None    Visit Diagnoses    Dermatophytosis of nail    -  Primary   Toe pain, bilateral       PVD (peripheral vascular disease) ([place])       Bilateral deafness       Diabetes mellitus without complication ([place])         -Examined patient.  -Discussed treatment options  for painful mycotic nails and recommend daily inspection of feet -Mechanically debrided and reduced mycotic nails with sterile nail nipper and dremel nail file without incident. -Patient to return in 3 months for follow up evaluation or sooner if symptoms worsen.  Landis Martins, DPM

## 2017-03-01 ENCOUNTER — Encounter: Payer: Self-pay | Admitting: Sports Medicine

## 2017-03-01 ENCOUNTER — Ambulatory Visit: Payer: PRIVATE HEALTH INSURANCE | Admitting: Sports Medicine

## 2017-03-01 DIAGNOSIS — E119 Type 2 diabetes mellitus without complications: Secondary | ICD-10-CM | POA: Diagnosis not present

## 2017-03-01 DIAGNOSIS — B351 Tinea unguium: Secondary | ICD-10-CM

## 2017-03-01 DIAGNOSIS — M79675 Pain in left toe(s): Secondary | ICD-10-CM

## 2017-03-01 DIAGNOSIS — M79674 Pain in right toe(s): Secondary | ICD-10-CM

## 2017-03-01 DIAGNOSIS — I739 Peripheral vascular disease, unspecified: Secondary | ICD-10-CM

## 2017-03-01 DIAGNOSIS — H9193 Unspecified hearing loss, bilateral: Secondary | ICD-10-CM

## 2017-03-01 NOTE — Progress Notes (Signed)
Subjective: Omarion Minnehan is a 61 y.o. diet controlled diabetic male patient seen today in office with complaint of painful thickened and elongated toenails; unable to trim.Patient has no other pedal complaints at this time.   Patient is deaf and does not need interpretor, prefers to come to visits alone. Patient is able to read lips and writing.  Patient Active Problem List   Diagnosis Date Noted  . Acquired deafness 01/28/2016  . Hyperlipidemia 01/28/2016  . Primary osteoarthritis of right hip 07/08/2014  . Contracture of knee joint 12/21/2011  . Knee pain 12/21/2011  . 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 acetaminophen (TYLENOL) 325 MG tablet Take 650 mg by mouth every 6 (six) hours as needed (pain).    Marland Kitchen aspirin EC 325 MG EC tablet Take 1 tablet (325 mg total) by mouth 2 (two) times daily after a meal. 60 tablet 0  . atorvastatin (LIPITOR) 10 MG tablet Take 10 mg by mouth.    . CRESTOR 10 MG tablet Take 10 mg by mouth 2 (two) times daily.  1  . cyclobenzaprine (FLEXERIL) 10 MG tablet Take 10 mg by mouth.    . doxycycline (VIBRAMYCIN) 100 MG capsule Take 100 mg by mouth.    Marland Kitchen HYDROcodone-acetaminophen (NORCO/VICODIN) 5-325 MG per tablet Take 1-2 tablets by mouth every 4 (four) hours as needed (breakthrough pain). 50 tablet 0  . ibuprofen (ADVIL,MOTRIN) 800 MG tablet TAKE 1 TABLET (800 MG TOTAL) BY MOUTH EVERY 8 (EIGHT) HOURS AS NEEDED FOR PAIN.    . meloxicam (MOBIC) 15 MG tablet TAKE ONE TABLET BY MOUTH EVERY DAY AFTER MEAL    . methocarbamol (ROBAXIN) 500 MG tablet Take 1 tablet (500 mg total) by mouth every 6 (six) hours as needed for muscle spasms. 50 tablet 0  . niacin (NIASPAN) 500 MG CR tablet Take 500 mg by mouth.    . rifampin (RIFADIN) 300 MG capsule Take 300 mg by mouth.     No current facility-administered medications on file prior to visit.     Allergies   Allergen Reactions  . Cefepime Rash and Hives  . Vancomycin Anaphylaxis    Objective: Physical Exam  General: Well developed, nourished, no acute distress, awake, alert and orientated   Vascular: Dorsalis pedis artery 1/4 bilateral, Posterior tibial artery 1/4 bilateral, skin temperature warm to warm proximal to distal bilateral lower extremities, + varicosities, scant pedal hair present bilateral.  Neurological: Gross sensation present via light touch bilateral. Protective sensation difficult to test due to patient status.   Dermatological: Skin is warm, dry, and supple bilateral, Nails 1-10 are tender, long, thick, and discolored with mild subungal debris especially the left hallux nail, no webspace macerations present bilateral, no open lesions present bilateral, no callus/corns/hyperkeratotic tissue present bilateral. No signs of infection bilateral.  Musculoskeletal: No symptomatic boney deformities noted bilateral. Muscular strength within normal limits without painon range of motion. No pain with calf compression bilateral.  Assessment and Plan:  Problem List Items Addressed This Visit    None    Visit Diagnoses    Dermatophytosis of nail    -  Primary   Toe pain, bilateral       PVD (peripheral vascular disease) ([place])       Bilateral deafness       Diabetes mellitus without complication ([place])         -Examined patient.  -Discussed  treatment options for painful mycotic nails and recommend daily inspection of feet -Mechanically debrided and reduced mycotic nails with sterile nail nipper and dremel nail file without incident. -Patient to return in 3 months for follow up evaluation or sooner if symptoms worsen.  Landis Martins, DPM

## 2017-04-04 DIAGNOSIS — R4701 Aphasia: Secondary | ICD-10-CM | POA: Insufficient documentation

## 2017-04-04 HISTORY — DX: Aphasia: R47.01

## 2017-05-31 ENCOUNTER — Encounter: Payer: Self-pay | Admitting: Sports Medicine

## 2017-05-31 ENCOUNTER — Ambulatory Visit: Payer: PRIVATE HEALTH INSURANCE | Admitting: Sports Medicine

## 2017-05-31 DIAGNOSIS — I739 Peripheral vascular disease, unspecified: Secondary | ICD-10-CM

## 2017-05-31 DIAGNOSIS — M79674 Pain in right toe(s): Secondary | ICD-10-CM | POA: Diagnosis not present

## 2017-05-31 DIAGNOSIS — B351 Tinea unguium: Secondary | ICD-10-CM

## 2017-05-31 DIAGNOSIS — E119 Type 2 diabetes mellitus without complications: Secondary | ICD-10-CM

## 2017-05-31 DIAGNOSIS — M79675 Pain in left toe(s): Secondary | ICD-10-CM | POA: Diagnosis not present

## 2017-05-31 DIAGNOSIS — H9193 Unspecified hearing loss, bilateral: Secondary | ICD-10-CM

## 2017-05-31 NOTE — Progress Notes (Signed)
Subjective: Zachary Lopez is a 62 y.o. diet controlled diabetic male patient seen today in office with complaint of painful thickened and elongated toenails; unable to trim.Patient has no other pedal complaints at this time.   Patient is deaf and does not need interpretor, prefers to come to visits alone. Patient is able to read lips and writing, as previously noted.  Patient Active Problem List   Diagnosis Date Noted  . Acquired deafness 01/28/2016  . Hyperlipidemia 01/28/2016  . Primary osteoarthritis of right hip 07/08/2014  . Contracture of knee joint 12/21/2011  . Knee pain 12/21/2011  . 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 acetaminophen (TYLENOL) 325 MG tablet Take 650 mg by mouth every 6 (six) hours as needed (pain).    Marland Kitchen aspirin EC 325 MG EC tablet Take 1 tablet (325 mg total) by mouth 2 (two) times daily after a meal. 60 tablet 0  . atorvastatin (LIPITOR) 10 MG tablet Take 10 mg by mouth.    . CRESTOR 10 MG tablet Take 10 mg by mouth 2 (two) times daily.  1  . cyclobenzaprine (FLEXERIL) 10 MG tablet Take 10 mg by mouth.    . doxycycline (VIBRAMYCIN) 100 MG capsule Take 100 mg by mouth.    Marland Kitchen HYDROcodone-acetaminophen (NORCO/VICODIN) 5-325 MG per tablet Take 1-2 tablets by mouth every 4 (four) hours as needed (breakthrough pain). 50 tablet 0  . ibuprofen (ADVIL,MOTRIN) 800 MG tablet TAKE 1 TABLET (800 MG TOTAL) BY MOUTH EVERY 8 (EIGHT) HOURS AS NEEDED FOR PAIN.    . meloxicam (MOBIC) 15 MG tablet TAKE ONE TABLET BY MOUTH EVERY DAY AFTER MEAL    . methocarbamol (ROBAXIN) 500 MG tablet Take 1 tablet (500 mg total) by mouth every 6 (six) hours as needed for muscle spasms. 50 tablet 0  . niacin (NIASPAN) 500 MG CR tablet Take 500 mg by mouth.    . rifampin (RIFADIN) 300 MG capsule Take 300 mg by mouth.     No current facility-administered medications on file prior to visit.      Allergies  Allergen Reactions  . Cefepime Rash and Hives  . Vancomycin Anaphylaxis    Objective: Physical Exam  General: Well developed, nourished, no acute distress, awake, alert and orientated   Vascular: Dorsalis pedis artery 1/4 bilateral, Posterior tibial artery 1/4 bilateral, skin temperature warm to warm proximal to distal bilateral lower extremities, + varicosities, scant pedal hair present bilateral.  Neurological: Gross sensation present via light touch bilateral. Protective sensation difficult to test due to patient status.   Dermatological: Skin is warm, dry, and supple bilateral, Nails 1-10 are tender, long, thick, and discolored with mild subungal debris especially the left hallux nail, no webspace macerations present bilateral, no open lesions present bilateral, no callus/corns/hyperkeratotic tissue present bilateral. No signs of infection bilateral.  Musculoskeletal: No symptomatic boney deformities noted bilateral. Muscular strength within normal limits without painon range of motion. No pain with calf compression bilateral.  Assessment and Plan:  Problem List Items Addressed This Visit    None    Visit Diagnoses    Dermatophytosis of nail    -  Primary   Toe pain, bilateral       PVD (peripheral vascular disease) ([place])       Bilateral deafness       Diabetes mellitus without complication ([place])         -Examined  patient.  -Discussed treatment options for painful mycotic nails and recommend daily inspection of feet in writing  -Mechanically debrided and reduced mycotic nails with sterile nail nipper and dremel nail file without incident. -Patient to return in 3 months for follow up evaluation or sooner if symptoms worsen.  Landis Martins, DPM

## 2017-08-30 ENCOUNTER — Encounter: Payer: Self-pay | Admitting: Sports Medicine

## 2017-08-30 ENCOUNTER — Ambulatory Visit: Payer: PRIVATE HEALTH INSURANCE | Admitting: Sports Medicine

## 2017-08-30 VITALS — BP 132/81 | HR 73 | Temp 98.5°F | Resp 16

## 2017-08-30 DIAGNOSIS — E119 Type 2 diabetes mellitus without complications: Secondary | ICD-10-CM | POA: Diagnosis not present

## 2017-08-30 DIAGNOSIS — M79674 Pain in right toe(s): Secondary | ICD-10-CM

## 2017-08-30 DIAGNOSIS — M79675 Pain in left toe(s): Secondary | ICD-10-CM

## 2017-08-30 DIAGNOSIS — H9193 Unspecified hearing loss, bilateral: Secondary | ICD-10-CM

## 2017-08-30 DIAGNOSIS — I739 Peripheral vascular disease, unspecified: Secondary | ICD-10-CM

## 2017-08-30 DIAGNOSIS — B351 Tinea unguium: Secondary | ICD-10-CM

## 2017-08-30 NOTE — Progress Notes (Signed)
Subjective: Zachary Lopez is a 62 y.o. diet controlled diabetic male patient seen today in office with complaint of painful thickened and elongated toenails; unable to trim.Patient has no other pedal complaints at this time. Signs to me to tell me that on Friday he had to go to hospital about his finger where they put in 3 stitches.  Patient is deaf and does not need interpretor, prefers to come to visits alone. Patient is able to read lips and writing, as previously noted.  Patient Active Problem List   Diagnosis Date Noted  . Acquired deafness 01/28/2016  . Hyperlipidemia 01/28/2016  . Primary osteoarthritis of right hip 07/08/2014  . Contracture of knee joint 12/21/2011  . Knee pain 12/21/2011  . 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 acetaminophen (TYLENOL) 325 MG tablet Take 650 mg by mouth every 6 (six) hours as needed (pain).    Marland Kitchen aspirin EC 325 MG EC tablet Take 1 tablet (325 mg total) by mouth 2 (two) times daily after a meal. 60 tablet 0  . atorvastatin (LIPITOR) 10 MG tablet Take 10 mg by mouth.    . CRESTOR 10 MG tablet Take 10 mg by mouth 2 (two) times daily.  1  . cyclobenzaprine (FLEXERIL) 10 MG tablet Take 10 mg by mouth.    . doxycycline (VIBRAMYCIN) 100 MG capsule Take 100 mg by mouth.    Marland Kitchen HYDROcodone-acetaminophen (NORCO/VICODIN) 5-325 MG per tablet Take 1-2 tablets by mouth every 4 (four) hours as needed (breakthrough pain). 50 tablet 0  . ibuprofen (ADVIL,MOTRIN) 800 MG tablet TAKE 1 TABLET (800 MG TOTAL) BY MOUTH EVERY 8 (EIGHT) HOURS AS NEEDED FOR PAIN.    . meloxicam (MOBIC) 15 MG tablet TAKE ONE TABLET BY MOUTH EVERY DAY AFTER MEAL    . methocarbamol (ROBAXIN) 500 MG tablet Take 1 tablet (500 mg total) by mouth every 6 (six) hours as needed for muscle spasms. 50 tablet 0  . niacin (NIASPAN) 500 MG CR tablet Take 500 mg by mouth.    . rifampin (RIFADIN) 300 MG  capsule Take 300 mg by mouth.     No current facility-administered medications on file prior to visit.     Allergies  Allergen Reactions  . Cefepime Rash and Hives  . Vancomycin Anaphylaxis    Objective: Physical Exam  General: Well developed, nourished, no acute distress, awake, alert and orientated   Vascular: Dorsalis pedis artery 1/4 bilateral, Posterior tibial artery 1/4 bilateral, skin temperature warm to warm proximal to distal bilateral lower extremities, + varicosities, scant pedal hair present bilateral.  Neurological: Gross sensation present via light touch bilateral. Protective sensation difficult to test due to patient status.   Dermatological: Skin is warm, dry, and supple bilateral, Nails 1-10 are tender, long, thick, and discolored with mild subungal debris especially the left hallux nail, no webspace macerations present bilateral, no open lesions present bilateral, no callus/corns/hyperkeratotic tissue present bilateral. No signs of infection bilateral.  Musculoskeletal: No symptomatic boney deformities noted bilateral. Muscular strength within normal limits without painon range of motion. No pain with calf compression bilateral.  Assessment and Plan:  Problem List Items Addressed This Visit    None    Visit Diagnoses    Dermatophytosis of nail    -  Primary   Toe pain, bilateral       PVD (peripheral vascular disease) ([place])  Bilateral deafness       Diabetes mellitus without complication ([place])         -Examined patient.  -Discussed treatment options for painful mycotic nails and recommend daily inspection of feet in writing  -Mechanically debrided and reduced mycotic nails with sterile nail nipper and dremel nail file without incident. -Patient to return in 3 months for follow up evaluation or sooner if symptoms worsen.  Landis Martins, DPM

## 2017-11-27 DIAGNOSIS — R350 Frequency of micturition: Secondary | ICD-10-CM | POA: Insufficient documentation

## 2017-11-27 DIAGNOSIS — R5381 Other malaise: Secondary | ICD-10-CM

## 2017-11-27 DIAGNOSIS — F330 Major depressive disorder, recurrent, mild: Secondary | ICD-10-CM

## 2017-11-27 DIAGNOSIS — N401 Enlarged prostate with lower urinary tract symptoms: Secondary | ICD-10-CM

## 2017-11-27 DIAGNOSIS — R5383 Other fatigue: Secondary | ICD-10-CM | POA: Insufficient documentation

## 2017-11-27 HISTORY — DX: Other malaise: R53.81

## 2017-11-27 HISTORY — DX: Major depressive disorder, recurrent, mild: F33.0

## 2017-11-27 HISTORY — DX: Benign prostatic hyperplasia with lower urinary tract symptoms: N40.1

## 2017-11-27 HISTORY — DX: Other fatigue: R53.83

## 2017-12-01 ENCOUNTER — Ambulatory Visit: Payer: PRIVATE HEALTH INSURANCE | Admitting: Sports Medicine

## 2017-12-01 ENCOUNTER — Encounter: Payer: Self-pay | Admitting: Sports Medicine

## 2017-12-01 VITALS — BP 107/70 | HR 70 | Resp 15

## 2017-12-01 DIAGNOSIS — B351 Tinea unguium: Secondary | ICD-10-CM | POA: Diagnosis not present

## 2017-12-01 DIAGNOSIS — H9193 Unspecified hearing loss, bilateral: Secondary | ICD-10-CM

## 2017-12-01 DIAGNOSIS — I739 Peripheral vascular disease, unspecified: Secondary | ICD-10-CM

## 2017-12-01 DIAGNOSIS — M79674 Pain in right toe(s): Secondary | ICD-10-CM | POA: Diagnosis not present

## 2017-12-01 DIAGNOSIS — E119 Type 2 diabetes mellitus without complications: Secondary | ICD-10-CM

## 2017-12-01 DIAGNOSIS — M79675 Pain in left toe(s): Secondary | ICD-10-CM

## 2017-12-01 NOTE — Progress Notes (Signed)
Subjective: Zachary Lopez is a 62 y.o. diet controlled diabetic male patient seen today in office with complaint of painful thickened and elongated toenails; unable to trim.Patient has no other pedal complaints at this time. Last saw PCP on Sept 16th.  Patient is deaf and does not need interpretor, prefers to come to visits alone. Patient is able to read lips and writing, as previously noted.  Patient Active Problem List   Diagnosis Date Noted  . Acquired deafness 01/28/2016  . Hyperlipidemia 01/28/2016  . Primary osteoarthritis of right hip 07/08/2014  . Contracture of knee joint 12/21/2011  . Knee pain 12/21/2011  . 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 acetaminophen (TYLENOL) 325 MG tablet Take 650 mg by mouth every 6 (six) hours as needed (pain).    Marland Kitchen aspirin EC 325 MG EC tablet Take 1 tablet (325 mg total) by mouth 2 (two) times daily after a meal. 60 tablet 0  . atorvastatin (LIPITOR) 10 MG tablet Take 10 mg by mouth.    . CRESTOR 10 MG tablet Take 10 mg by mouth 2 (two) times daily.  1  . cyclobenzaprine (FLEXERIL) 10 MG tablet Take 10 mg by mouth.    . doxycycline (VIBRAMYCIN) 100 MG capsule Take 100 mg by mouth.    Marland Kitchen HYDROcodone-acetaminophen (NORCO/VICODIN) 5-325 MG per tablet Take 1-2 tablets by mouth every 4 (four) hours as needed (breakthrough pain). 50 tablet 0  . ibuprofen (ADVIL,MOTRIN) 800 MG tablet TAKE 1 TABLET (800 MG TOTAL) BY MOUTH EVERY 8 (EIGHT) HOURS AS NEEDED FOR PAIN.    . meloxicam (MOBIC) 15 MG tablet TAKE ONE TABLET BY MOUTH EVERY DAY AFTER MEAL    . methocarbamol (ROBAXIN) 500 MG tablet Take 1 tablet (500 mg total) by mouth every 6 (six) hours as needed for muscle spasms. 50 tablet 0  . niacin (NIASPAN) 500 MG CR tablet Take 500 mg by mouth.    . rifampin (RIFADIN) 300 MG capsule Take 300 mg by mouth.     No current facility-administered medications  on file prior to visit.     Allergies  Allergen Reactions  . Cefepime Rash and Hives  . Vancomycin Anaphylaxis    Objective: Physical Exam  General: Well developed, nourished, no acute distress, awake, alert and orientated   Vascular: Dorsalis pedis artery 1/4 bilateral, Posterior tibial artery 1/4 bilateral, skin temperature warm to warm proximal to distal bilateral lower extremities, + varicosities, scant pedal hair present bilateral.  Neurological: Gross sensation present via light touch bilateral. Protective sensation difficult to test due to patient status.   Dermatological: Skin is warm, dry, and supple bilateral, Nails 1-10 are tender, long, thick, and discolored with mild subungal debris especially the left hallux nail, no webspace macerations present bilateral, no open lesions present bilateral, no callus/corns/hyperkeratotic tissue present bilateral. No signs of infection bilateral.  Musculoskeletal: No symptomatic boney deformities noted bilateral. Muscular strength within normal limits without painon range of motion. No pain with calf compression bilateral.  Assessment and Plan:  Problem List Items Addressed This Visit    None    Visit Diagnoses    Dermatophytosis of nail    -  Primary   Toe pain, bilateral       PVD (peripheral vascular disease) ([place])       Bilateral deafness       Diabetes mellitus without complication ([place])         -  Examined patient.  -Discussed plan of care in writing  -Mechanically debrided and reduced mycotic nails with sterile nail nipper and dremel nail file without incident. -Patient to return in 3 months for follow up evaluation or sooner if symptoms worsen.  Landis Martins, DPM

## 2018-03-02 ENCOUNTER — Encounter: Payer: Self-pay | Admitting: Sports Medicine

## 2018-03-02 ENCOUNTER — Ambulatory Visit: Payer: PRIVATE HEALTH INSURANCE | Admitting: Sports Medicine

## 2018-03-02 VITALS — BP 125/83 | HR 83 | Resp 16

## 2018-03-02 DIAGNOSIS — M79674 Pain in right toe(s): Secondary | ICD-10-CM

## 2018-03-02 DIAGNOSIS — H9193 Unspecified hearing loss, bilateral: Secondary | ICD-10-CM | POA: Diagnosis not present

## 2018-03-02 DIAGNOSIS — M79675 Pain in left toe(s): Secondary | ICD-10-CM

## 2018-03-02 DIAGNOSIS — I739 Peripheral vascular disease, unspecified: Secondary | ICD-10-CM | POA: Diagnosis not present

## 2018-03-02 DIAGNOSIS — E119 Type 2 diabetes mellitus without complications: Secondary | ICD-10-CM | POA: Diagnosis not present

## 2018-03-02 DIAGNOSIS — B351 Tinea unguium: Secondary | ICD-10-CM | POA: Diagnosis not present

## 2018-03-02 NOTE — Progress Notes (Signed)
Subjective: Zachary Lopez is a 62 y.o. diet controlled diabetic male patient seen today in office with complaint of painful thickened and elongated toenails; unable to trim.Patient has no other pedal complaints at this time.  Patient is deaf but prefers to come to appointments along without interpreter. Patient is able to read lips and writing, as previously noted.  Patient Active Problem List   Diagnosis Date Noted  . Acquired deafness 01/28/2016  . Hyperlipidemia 01/28/2016  . Primary osteoarthritis of right hip 07/08/2014  . Contracture of knee joint 12/21/2011  . Knee pain 12/21/2011  . 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 acetaminophen (TYLENOL) 325 MG tablet Take 650 mg by mouth every 6 (six) hours as needed (pain).    Marland Kitchen aspirin EC 325 MG EC tablet Take 1 tablet (325 mg total) by mouth 2 (two) times daily after a meal. 60 tablet 0  . atorvastatin (LIPITOR) 10 MG tablet Take 10 mg by mouth.    . CRESTOR 10 MG tablet Take 10 mg by mouth 2 (two) times daily.  1  . cyclobenzaprine (FLEXERIL) 10 MG tablet Take 10 mg by mouth.    . doxycycline (VIBRAMYCIN) 100 MG capsule Take 100 mg by mouth.    Marland Kitchen HYDROcodone-acetaminophen (NORCO/VICODIN) 5-325 MG per tablet Take 1-2 tablets by mouth every 4 (four) hours as needed (breakthrough pain). 50 tablet 0  . ibuprofen (ADVIL,MOTRIN) 800 MG tablet TAKE 1 TABLET (800 MG TOTAL) BY MOUTH EVERY 8 (EIGHT) HOURS AS NEEDED FOR PAIN.    . meloxicam (MOBIC) 15 MG tablet TAKE ONE TABLET BY MOUTH EVERY DAY AFTER MEAL    . methocarbamol (ROBAXIN) 500 MG tablet Take 1 tablet (500 mg total) by mouth every 6 (six) hours as needed for muscle spasms. 50 tablet 0  . niacin (NIASPAN) 500 MG CR tablet Take 500 mg by mouth.    . rifampin (RIFADIN) 300 MG capsule Take 300 mg by mouth.     No current facility-administered medications on file prior to visit.      Allergies  Allergen Reactions  . Cefepime Rash and Hives  . Vancomycin Anaphylaxis    Objective: Physical Exam  General: Well developed, nourished, no acute distress, awake, alert and orientated   Vascular: Dorsalis pedis artery 1/4 bilateral, Posterior tibial artery 1/4 bilateral, skin temperature warm to warm proximal to distal bilateral lower extremities, + varicosities, scant pedal hair present bilateral.  Neurological: Gross sensation present via light touch bilateral. Protective sensation difficult to test due to patient status.   Dermatological: Skin is warm, dry, and supple bilateral, Nails 1-10 are tender, long, thick, and discolored with mild subungal debris especially the left hallux nail, no webspace macerations present bilateral, no open lesions present bilateral, no callus/corns/hyperkeratotic tissue present bilateral. No signs of infection bilateral.  Musculoskeletal: No symptomatic boney deformities noted bilateral. Muscular strength within normal limits without painon range of motion. No pain with calf compression bilateral.  Assessment and Plan:  Problem List Items Addressed This Visit    None    Visit Diagnoses    Dermatophytosis of nail    -  Primary   Toe pain, bilateral       PVD (peripheral vascular disease) ([place])       Bilateral deafness       Diabetes mellitus without complication ([place])         -Examined patient.  -Mechanically  debrided and reduced mycotic nails with sterile nail nipper and dremel nail file without incident. -Patient to return in 3 months for follow up evaluation or sooner if symptoms worsen.  Landis Martins, DPM

## 2018-06-01 ENCOUNTER — Ambulatory Visit: Payer: PRIVATE HEALTH INSURANCE | Admitting: Sports Medicine

## 2018-06-01 ENCOUNTER — Encounter: Payer: Self-pay | Admitting: Sports Medicine

## 2018-06-01 ENCOUNTER — Other Ambulatory Visit: Payer: Self-pay

## 2018-06-01 VITALS — BP 131/87 | HR 103 | Resp 16

## 2018-06-01 DIAGNOSIS — B351 Tinea unguium: Secondary | ICD-10-CM

## 2018-06-01 DIAGNOSIS — M79675 Pain in left toe(s): Secondary | ICD-10-CM | POA: Diagnosis not present

## 2018-06-01 DIAGNOSIS — I739 Peripheral vascular disease, unspecified: Secondary | ICD-10-CM

## 2018-06-01 DIAGNOSIS — E119 Type 2 diabetes mellitus without complications: Secondary | ICD-10-CM | POA: Diagnosis not present

## 2018-06-01 DIAGNOSIS — M79674 Pain in right toe(s): Secondary | ICD-10-CM | POA: Diagnosis not present

## 2018-06-01 DIAGNOSIS — H9193 Unspecified hearing loss, bilateral: Secondary | ICD-10-CM

## 2018-06-01 NOTE — Progress Notes (Signed)
Subjective: Aikam Vinje is a 63 y.o. diet controlled diabetic male patient seen today in office with complaint of painful thickened and elongated toenails; unable to trim.Patient reports that his left big toenail split.  Patient is deaf but prefers to come to appointments along without interpreter. Patient is able to read lips and writing, as previously noted.  Patient Active Problem List   Diagnosis Date Noted  . Acquired deafness 01/28/2016  . Hyperlipidemia 01/28/2016  . Primary osteoarthritis of right hip 07/08/2014  . Contracture of knee joint 12/21/2011  . Knee pain 12/21/2011  . 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 acetaminophen (TYLENOL) 325 MG tablet Take 650 mg by mouth every 6 (six) hours as needed (pain).    Marland Kitchen aspirin EC 325 MG EC tablet Take 1 tablet (325 mg total) by mouth 2 (two) times daily after a meal. 60 tablet 0  . atorvastatin (LIPITOR) 10 MG tablet Take 10 mg by mouth.    . CRESTOR 10 MG tablet Take 10 mg by mouth 2 (two) times daily.  1  . cyclobenzaprine (FLEXERIL) 10 MG tablet Take 10 mg by mouth.    . escitalopram (LEXAPRO) 5 MG tablet TAKE 1 TABLET BY MOUTH EVERY DAY    . HYDROcodone-acetaminophen (NORCO/VICODIN) 5-325 MG per tablet Take 1-2 tablets by mouth every 4 (four) hours as needed (breakthrough pain). 50 tablet 0  . ibuprofen (ADVIL,MOTRIN) 800 MG tablet TAKE 1 TABLET (800 MG TOTAL) BY MOUTH EVERY 8 (EIGHT) HOURS AS NEEDED FOR PAIN.    . meloxicam (MOBIC) 15 MG tablet TAKE ONE TABLET BY MOUTH EVERY DAY AFTER MEAL    . metFORMIN (GLUCOPHAGE-XR) 500 MG 24 hr tablet     . methocarbamol (ROBAXIN) 500 MG tablet Take 1 tablet (500 mg total) by mouth every 6 (six) hours as needed for muscle spasms. 50 tablet 0  . niacin (NIASPAN) 500 MG CR tablet Take 500 mg by mouth.    . rifampin (RIFADIN) 300 MG capsule Take 300 mg by mouth.    . tamsulosin  (FLOMAX) 0.4 MG CAPS capsule Take by mouth.     No current facility-administered medications on file prior to visit.     Allergies  Allergen Reactions  . Cefepime Rash and Hives  . Vancomycin Anaphylaxis    Objective: Physical Exam  General: Well developed, nourished, no acute distress, awake, alert and orientated   Vascular: Dorsalis pedis artery 1/4 bilateral, Posterior tibial artery 1/4 bilateral, skin temperature warm to warm proximal to distal bilateral lower extremities, + varicosities, scant pedal hair present bilateral.  Neurological: Gross sensation present via light touch bilateral. Protective sensation difficult to test due to patient status.   Dermatological: Skin is warm, dry, and supple bilateral, Nails 1-10 are tender, long, thick, and discolored with mild subungal debris especially the left hallux nail like before with distal lysis, no webspace macerations present bilateral, no open lesions present bilateral, no callus/corns/hyperkeratotic tissue present bilateral. No signs of infection bilateral.  Musculoskeletal: No symptomatic boney deformities noted bilateral. Muscular strength within normal limits without painon range of motion. No pain with calf compression bilateral.  Assessment and Plan:  Problem List Items Addressed This Visit    None    Visit Diagnoses    Dermatophytosis of nail    -  Primary   Toe pain, bilateral       PVD (peripheral vascular disease) ([place])  Bilateral deafness       Diabetes mellitus without complication (HCC)       Relevant Medications   metFORMIN (GLUCOPHAGE-XR) 500 MG 24 hr tablet     -Examined patient.  -Mechanically debrided and reduced mycotic nails with sterile nail nipper and dremel nail file without incident. -Continue with daily inspection in the setting of diabetes -Patient to return in 3 months for follow up evaluation or sooner if symptoms worsen.  Landis Martins, DPM

## 2018-08-16 ENCOUNTER — Encounter: Payer: Self-pay | Admitting: Sports Medicine

## 2018-08-16 ENCOUNTER — Ambulatory Visit: Payer: PRIVATE HEALTH INSURANCE | Admitting: Sports Medicine

## 2018-08-16 ENCOUNTER — Other Ambulatory Visit: Payer: Self-pay

## 2018-08-16 VITALS — Temp 96.9°F

## 2018-08-16 DIAGNOSIS — Q828 Other specified congenital malformations of skin: Secondary | ICD-10-CM | POA: Diagnosis not present

## 2018-08-16 DIAGNOSIS — E119 Type 2 diabetes mellitus without complications: Secondary | ICD-10-CM

## 2018-08-16 DIAGNOSIS — M779 Enthesopathy, unspecified: Secondary | ICD-10-CM | POA: Diagnosis not present

## 2018-08-16 DIAGNOSIS — M79672 Pain in left foot: Secondary | ICD-10-CM | POA: Diagnosis not present

## 2018-08-16 DIAGNOSIS — H9193 Unspecified hearing loss, bilateral: Secondary | ICD-10-CM

## 2018-08-16 MED ORDER — TRIAMCINOLONE ACETONIDE 10 MG/ML IJ SUSP
10.0000 mg | Freq: Once | INTRAMUSCULAR | Status: AC
  Administered 2018-08-16: 10 mg

## 2018-08-16 NOTE — Progress Notes (Signed)
Subjective: Zachary Lopez is a 63 y.o. male patient who presents to office for evaluation of left foot pain. Patient complains of progressive pain especially over the last 3 weeks that started after he was mowing the yard states that there is a area that has been rubbing and hurting went to [facility] orthopedic and they did x-rays and a MRI which did not reveal anything.  Patient reports that there is also some hard skin to the area and it hurts to walk with throbbing tenderness and burning.  Patient is assisted today by interpreter who helps to report this history.  Patient Active Problem List   Diagnosis Date Noted  . Acquired deafness 01/28/2016  . Hyperlipidemia 01/28/2016  . Primary osteoarthritis of right hip 07/08/2014  . Contracture of knee joint 12/21/2011  . Knee pain 12/21/2011  . 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 acetaminophen (TYLENOL) 325 MG tablet Take 650 mg by mouth every 6 (six) hours as needed (pain).    Marland Kitchen aspirin EC 325 MG EC tablet Take 1 tablet (325 mg total) by mouth 2 (two) times daily after a meal. 60 tablet 0  . atorvastatin (LIPITOR) 10 MG tablet Take 10 mg by mouth.    . CRESTOR 10 MG tablet Take 10 mg by mouth 2 (two) times daily.  1  . cyclobenzaprine (FLEXERIL) 10 MG tablet Take 10 mg by mouth.    . escitalopram (LEXAPRO) 5 MG tablet TAKE 1 TABLET BY MOUTH EVERY DAY    . HYDROcodone-acetaminophen (NORCO/VICODIN) 5-325 MG per tablet Take 1-2 tablets by mouth every 4 (four) hours as needed (breakthrough pain). 50 tablet 0  . ibuprofen (ADVIL,MOTRIN) 800 MG tablet TAKE 1 TABLET (800 MG TOTAL) BY MOUTH EVERY 8 (EIGHT) HOURS AS NEEDED FOR PAIN.    . meloxicam (MOBIC) 15 MG tablet TAKE ONE TABLET BY MOUTH EVERY DAY AFTER MEAL    . metFORMIN (GLUCOPHAGE-XR) 500 MG 24 hr tablet     . methocarbamol (ROBAXIN) 500 MG tablet Take 1 tablet (500 mg total) by mouth  every 6 (six) hours as needed for muscle spasms. 50 tablet 0  . niacin (NIASPAN) 500 MG CR tablet Take 500 mg by mouth.    . rifampin (RIFADIN) 300 MG capsule Take 300 mg by mouth.    . tamsulosin (FLOMAX) 0.4 MG CAPS capsule Take by mouth.     No current facility-administered medications on file prior to visit.     Allergies  Allergen Reactions  . Cefepime Rash and Hives  . Vancomycin Anaphylaxis    Objective:  General: Alert and oriented x3 in no acute distress.  Patient is deaf using sign language interpreter this visit.  Dermatology:+ Callus sub-met 5 on left with a nucleated core without surrounding signs of infection.  No open lesions bilateral lower extremities, no webspace macerations, no ecchymosis bilateral, all nails x 10 are mildly elongated.  Vascular: Dorsalis Pedis and Posterior Tibial pedal pulses palpable, Capillary Fill Time 3 seconds,(+) pedal hair growth bilateral, no edema bilateral lower extremities, varicosities bilateral, temperature gradient within normal limits.  Neurology: Johney Maine sensation intact via light touch bilateral.  Musculoskeletal: Mild tenderness with palpation at fifth metatarsal phalangeal joint on the left foot.  There is pain with palpation to the callus at the plantar aspect of the left foot.  Gait: Antalgic gait  Assessment and Plan: Problem List Items Addressed This Visit  None    Visit Diagnoses    Capsulitis    -  Primary   Relevant Medications   triamcinolone acetonide (KENALOG) 10 MG/ML injection 10 mg (Completed) (Start on 08/16/2018 10:15 AM)   Porokeratosis       Relevant Medications   triamcinolone acetonide (KENALOG) 10 MG/ML injection 10 mg (Completed) (Start on 08/16/2018 10:15 AM)   Left foot pain       Relevant Medications   triamcinolone acetonide (KENALOG) 10 MG/ML injection 10 mg (Completed) (Start on 08/16/2018 10:15 AM)   Diabetes mellitus without complication ([place])       Bilateral deafness           -Complete  examination performed -Xrays and imaging reviewed from [place] which is negative -Discussed treatement options for likely capsulitis with callus secondary to overuse in his shoes when he was mowing the yard -After oral consent and aseptic prep, injected a mixture containing 1 ml of 2%  plain lidocaine, 1 ml 0.5% plain marcaine, 0.5 ml of kenalog 10 and 0.5 ml of dexamethasone phosphate into fifth metatarsal phalangeal joint on left without complication. Post-injection care discussed with patient.  -Mechanically debrided keratotic lesion using a sterile chisel blade removing nucleated core and then applied topical salinocaine covered with Band-Aid -Advised good supportive shoes and to avoid activities that irritate the area -Patient to return to office as scheduled for his routine nail care or sooner if condition worsens.  Landis Martins, DPM

## 2018-08-31 ENCOUNTER — Other Ambulatory Visit: Payer: Self-pay

## 2018-08-31 ENCOUNTER — Encounter: Payer: Self-pay | Admitting: Sports Medicine

## 2018-08-31 ENCOUNTER — Ambulatory Visit: Payer: PRIVATE HEALTH INSURANCE | Admitting: Sports Medicine

## 2018-08-31 DIAGNOSIS — E119 Type 2 diabetes mellitus without complications: Secondary | ICD-10-CM | POA: Diagnosis not present

## 2018-08-31 DIAGNOSIS — B351 Tinea unguium: Secondary | ICD-10-CM

## 2018-08-31 DIAGNOSIS — M79675 Pain in left toe(s): Secondary | ICD-10-CM

## 2018-08-31 DIAGNOSIS — M79674 Pain in right toe(s): Secondary | ICD-10-CM | POA: Diagnosis not present

## 2018-08-31 DIAGNOSIS — H9193 Unspecified hearing loss, bilateral: Secondary | ICD-10-CM | POA: Diagnosis not present

## 2018-08-31 NOTE — Progress Notes (Signed)
Subjective: Zachary Lopez is a 63 y.o. diet controlled diabetic male patient seen today in office with complaint of painful thickened and elongated toenails; unable to trim.Patient reports that his left big pain is better with no recurrence or problems.  Patient is deaf and has interpreter with him this visit. Patient denies any other issues at this time.  Patient Active Problem List   Diagnosis Date Noted  . Acquired deafness 01/28/2016  . Hyperlipidemia 01/28/2016  . Primary osteoarthritis of right hip 07/08/2014  . Contracture of knee joint 12/21/2011  . Knee pain 12/21/2011  . 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 CRESTOR 10 MG tablet Take 10 mg by mouth 2 (two) times daily.  1  . escitalopram (LEXAPRO) 5 MG tablet TAKE 1 TABLET BY MOUTH EVERY DAY    . metFORMIN (GLUCOPHAGE-XR) 500 MG 24 hr tablet     . rifampin (RIFADIN) 300 MG capsule Take 300 mg by mouth.    . tamsulosin (FLOMAX) 0.4 MG CAPS capsule Take by mouth.     No current facility-administered medications on file prior to visit.     Allergies  Allergen Reactions  . Cefepime Rash and Hives  . Vancomycin Anaphylaxis    Objective: Physical Exam  General: Well developed, nourished, no acute distress, awake, alert and orientated   Vascular: Dorsalis pedis artery 1/4 bilateral, Posterior tibial artery 1/4 bilateral, skin temperature warm to warm proximal to distal bilateral lower extremities, + varicosities, scant pedal hair present bilateral.  Neurological: Gross sensation present via light touch bilateral. Protective sensation difficult to test due to patient status.   Dermatological: Skin is warm, dry, and supple bilateral, Nails 1-10 are tender, long, thick, and discolored with mild subungal debris especially the left hallux nail like before with distal lysis upon debridement of the left great toenail there  was a minimal amount of purulent drainage, no webspace macerations present bilateral, no open lesions present bilateral, no callus/corns/hyperkeratotic tissue present bilateral. No signs of infection bilateral.  Musculoskeletal: No symptomatic boney deformities noted bilateral. Muscular strength within normal limits without painon range of motion. No pain with calf compression bilateral.  Assessment and Plan:  Problem List Items Addressed This Visit    None    Visit Diagnoses    Dermatophytosis of nail    -  Primary   Toe pain, bilateral       Bilateral deafness       Diabetes mellitus without complication ([place])         -Examined patient.  -Mechanically debrided and reduced mycotic nails with sterile nail nipper and dremel nail file without incident. -Advised patient to use antibiotic cream soak with Epson salt and if left great toenail worsens to return to office sooner -Continue with daily inspection in the setting of diabetes -Patient to return in 3 months for follow up evaluation or sooner if symptoms worsen.  Landis Martins, DPM

## 2018-11-09 ENCOUNTER — Other Ambulatory Visit: Payer: Self-pay

## 2018-11-09 ENCOUNTER — Encounter: Payer: Self-pay | Admitting: Sports Medicine

## 2018-11-09 ENCOUNTER — Ambulatory Visit: Payer: PRIVATE HEALTH INSURANCE | Admitting: Sports Medicine

## 2018-11-09 DIAGNOSIS — H9193 Unspecified hearing loss, bilateral: Secondary | ICD-10-CM | POA: Diagnosis not present

## 2018-11-09 DIAGNOSIS — M79674 Pain in right toe(s): Secondary | ICD-10-CM

## 2018-11-09 DIAGNOSIS — E119 Type 2 diabetes mellitus without complications: Secondary | ICD-10-CM | POA: Diagnosis not present

## 2018-11-09 DIAGNOSIS — B351 Tinea unguium: Secondary | ICD-10-CM

## 2018-11-09 DIAGNOSIS — M79675 Pain in left toe(s): Secondary | ICD-10-CM

## 2018-11-09 NOTE — Progress Notes (Signed)
Subjective: Zachary Lopez is a 63 y.o. diet controlled diabetic male patient seen today in office with complaint of painful thickened and elongated toenails; unable to trim. Patient denies any other issues at this time.  Patient Active Problem List   Diagnosis Date Noted  . Acquired deafness 01/28/2016  . Hyperlipidemia 01/28/2016  . Primary osteoarthritis of right hip 07/08/2014  . Contracture of knee joint 12/21/2011  . Knee pain 12/21/2011  . 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 CRESTOR 10 MG tablet Take 10 mg by mouth 2 (two) times daily.  1  . escitalopram (LEXAPRO) 5 MG tablet TAKE 1 TABLET BY MOUTH EVERY DAY    . metFORMIN (GLUCOPHAGE-XR) 500 MG 24 hr tablet     . rifampin (RIFADIN) 300 MG capsule Take 300 mg by mouth.    . tamsulosin (FLOMAX) 0.4 MG CAPS capsule Take by mouth.     No current facility-administered medications on file prior to visit.     Allergies  Allergen Reactions  . Cefepime Rash and Hives  . Vancomycin Anaphylaxis    Objective: Physical Exam  General: Well developed, nourished, no acute distress, awake, alert and orientated   Vascular: Dorsalis pedis artery 1/4 bilateral, Posterior tibial artery 1/4 bilateral, skin temperature warm to warm proximal to distal bilateral lower extremities, + varicosities, scant pedal hair present bilateral.  Neurological: Gross sensation present via light touch bilateral. Protective sensation difficult to test due to patient status.   Dermatological: Skin is warm, dry, and supple bilateral, Nails 1-10 are tender, long, thick, and discolored with mild subungal debris especially the left hallux nail like before with distal lysis upon debridement of the left great toenail there was a minimal amount of purulent drainage, no webspace macerations present bilateral, no open lesions present bilateral, no  callus/corns/hyperkeratotic tissue present bilateral. No signs of infection bilateral.  Musculoskeletal: No symptomatic boney deformities noted bilateral. Muscular strength within normal limits without painon range of motion. No pain with calf compression bilateral.  Assessment and Plan:  Problem List Items Addressed This Visit    None    Visit Diagnoses    Dermatophytosis of nail    -  Primary   Toe pain, bilateral       Bilateral deafness       Diabetes mellitus without complication ([place])         -Examined patient.  -Mechanically debrided and reduced mycotic nails with sterile nail nipper and dremel nail file without incident. -Continue with daily inspection in the setting of diabetes -Patient to return in 3 months for follow up evaluation or sooner if symptoms worsen.  Landis Martins, DPM

## 2019-02-01 ENCOUNTER — Encounter: Payer: Self-pay | Admitting: Sports Medicine

## 2019-02-01 ENCOUNTER — Ambulatory Visit (INDEPENDENT_AMBULATORY_CARE_PROVIDER_SITE_OTHER): Payer: PRIVATE HEALTH INSURANCE | Admitting: Sports Medicine

## 2019-02-01 ENCOUNTER — Other Ambulatory Visit: Payer: Self-pay

## 2019-02-01 DIAGNOSIS — E119 Type 2 diabetes mellitus without complications: Secondary | ICD-10-CM | POA: Diagnosis not present

## 2019-02-01 DIAGNOSIS — M79674 Pain in right toe(s): Secondary | ICD-10-CM | POA: Diagnosis not present

## 2019-02-01 DIAGNOSIS — B351 Tinea unguium: Secondary | ICD-10-CM | POA: Diagnosis not present

## 2019-02-01 DIAGNOSIS — M79675 Pain in left toe(s): Secondary | ICD-10-CM

## 2019-02-01 DIAGNOSIS — H9193 Unspecified hearing loss, bilateral: Secondary | ICD-10-CM

## 2019-02-01 NOTE — Progress Notes (Signed)
Subjective: Zachary Lopez is a 63 y.o. diet controlled diabetic male patient seen today in office with complaint of painful thickened and elongated toenails; unable to trim.  Patient is assisted by interpreter this visit.  Patient denies any pedal complaints at this time.    FBS diet controlled does not check.  Patient Active Problem List   Diagnosis Date Noted  . Acquired deafness 01/28/2016  . Hyperlipidemia 01/28/2016  . Primary osteoarthritis of right hip 07/08/2014  . Contracture of knee joint 12/21/2011  . Knee pain 12/21/2011  . 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 CRESTOR 10 MG tablet Take 10 mg by mouth 2 (two) times daily.  1  . escitalopram (LEXAPRO) 5 MG tablet TAKE 1 TABLET BY MOUTH EVERY DAY    . metFORMIN (GLUCOPHAGE-XR) 500 MG 24 hr tablet     . rifampin (RIFADIN) 300 MG capsule Take 300 mg by mouth.    . tamsulosin (FLOMAX) 0.4 MG CAPS capsule Take by mouth.     No current facility-administered medications on file prior to visit.     Allergies  Allergen Reactions  . Cefepime Rash and Hives  . Vancomycin Anaphylaxis    Objective: Physical Exam  General: Well developed, nourished, no acute distress, awake, alert and orientated   Vascular: Dorsalis pedis artery 1/4 bilateral, Posterior tibial artery 1/4 bilateral, skin temperature warm to warm proximal to distal bilateral lower extremities, + varicosities, scant pedal hair present bilateral.  Neurological: Gross sensation present via light touch bilateral. Protective sensation difficult to test due to patient status.   Dermatological: Skin is warm, dry, and supple bilateral, Nails 1-10 are tender, long, thick, and discolored with mild subungal debris, dried blood at left second toenail with no acute signs of infection.  No open lesions present bilateral, no callus/corns/hyperkeratotic tissue present  bilateral. No signs of infection bilateral.  Musculoskeletal: No symptomatic boney deformities noted bilateral. Muscular strength within normal limits without painon range of motion. No pain with calf compression bilateral.  Assessment and Plan:  Problem List Items Addressed This Visit    None    Visit Diagnoses    Dermatophytosis of nail    -  Primary   Toe pain, bilateral       Bilateral deafness       Diabetes mellitus without complication ([place])         -Examined patient.  -Discussed diabetic foot care -Mechanically debrided and reduced mycotic nails with sterile nail nipper and dremel nail file without incident. -Advised patient that dried blood will slowly grow out and left second toenail and to closely monitor -Patient to return in 3 months for follow up evaluation or sooner if symptoms worsen.  Landis Martins, DPM

## 2019-05-08 ENCOUNTER — Other Ambulatory Visit: Payer: Self-pay

## 2019-05-08 ENCOUNTER — Ambulatory Visit (INDEPENDENT_AMBULATORY_CARE_PROVIDER_SITE_OTHER): Payer: PRIVATE HEALTH INSURANCE | Admitting: Sports Medicine

## 2019-05-08 ENCOUNTER — Encounter: Payer: Self-pay | Admitting: Sports Medicine

## 2019-05-08 DIAGNOSIS — B351 Tinea unguium: Secondary | ICD-10-CM

## 2019-05-08 DIAGNOSIS — M79675 Pain in left toe(s): Secondary | ICD-10-CM

## 2019-05-08 DIAGNOSIS — M79674 Pain in right toe(s): Secondary | ICD-10-CM

## 2019-05-08 DIAGNOSIS — E119 Type 2 diabetes mellitus without complications: Secondary | ICD-10-CM | POA: Diagnosis not present

## 2019-05-08 DIAGNOSIS — H9193 Unspecified hearing loss, bilateral: Secondary | ICD-10-CM

## 2019-05-08 NOTE — Progress Notes (Signed)
Subjective: Zachary Lopez is a 64 y.o. diet controlled diabetic male patient seen today in office with complaint of painful thickened and elongated toenails; unable to trim.  Patient is assisted by interpreter this visit.  Patient denies any pedal complaints at this time.    REPORTS THAT HE IS NO LONGER CONSIDERED DIABETIC.   Patient Active Problem List   Diagnosis Date Noted  . Acquired deafness 01/28/2016  . Hyperlipidemia 01/28/2016  . Primary osteoarthritis of right hip 07/08/2014  . Contracture of knee joint 12/21/2011  . Knee pain 12/21/2011  . 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 CRESTOR 10 MG tablet Take 10 mg by mouth 2 (two) times daily.  1  . escitalopram (LEXAPRO) 5 MG tablet TAKE 1 TABLET BY MOUTH EVERY DAY    . metFORMIN (GLUCOPHAGE-XR) 500 MG 24 hr tablet     . rifampin (RIFADIN) 300 MG capsule Take 300 mg by mouth.    . tamsulosin (FLOMAX) 0.4 MG CAPS capsule Take by mouth.     No current facility-administered medications on file prior to visit.    Allergies  Allergen Reactions  . Cefepime Rash and Hives  . Vancomycin Anaphylaxis    Objective: Physical Exam  General: Well developed, nourished, no acute distress, awake, alert and orientated   Vascular: Dorsalis pedis artery 1/4 bilateral, Posterior tibial artery 1/4 bilateral, skin temperature warm to warm proximal to distal bilateral lower extremities, + varicosities, scant pedal hair present bilateral.  Neurological: Gross sensation present via light touch bilateral. Protective sensation difficult to test due to patient status.   Dermatological: Skin is warm, dry, and supple bilateral, Nails 1-10 are tender, long, thick, and discolored with mild subungal debris, STABLE dried blood at left second toenail that has almost completely grown out, no acute signs of infection.  No open lesions present  bilateral, no callus/corns/hyperkeratotic tissue present bilateral. No signs of infection bilateral.  Musculoskeletal: No symptomatic boney deformities noted bilateral. Muscular strength within normal limits without painon range of motion. No pain with calf compression bilateral.  Assessment and Plan:  Problem List Items Addressed This Visit    None    Visit Diagnoses    Dermatophytosis of nail    -  Primary   Toe pain, bilateral       Bilateral deafness       Diabetes mellitus without complication ([place])         -Examined patient.  -Re-discussed diabetic foot care -Mechanically debrided and reduced mycotic nails with sterile nail nipper and dremel nail file without incident. -Patient to return in 3 months for follow up evaluation or sooner if symptoms worsen.  Landis Martins, DPM

## 2019-06-24 DIAGNOSIS — J301 Allergic rhinitis due to pollen: Secondary | ICD-10-CM | POA: Insufficient documentation

## 2019-06-24 HISTORY — DX: Allergic rhinitis due to pollen: J30.1

## 2019-08-01 ENCOUNTER — Other Ambulatory Visit: Payer: Self-pay

## 2019-08-01 ENCOUNTER — Ambulatory Visit (INDEPENDENT_AMBULATORY_CARE_PROVIDER_SITE_OTHER): Payer: PRIVATE HEALTH INSURANCE | Admitting: Podiatry

## 2019-08-01 ENCOUNTER — Encounter: Payer: Self-pay | Admitting: Podiatry

## 2019-08-01 DIAGNOSIS — E119 Type 2 diabetes mellitus without complications: Secondary | ICD-10-CM

## 2019-08-01 DIAGNOSIS — B351 Tinea unguium: Secondary | ICD-10-CM

## 2019-08-01 NOTE — Progress Notes (Signed)
Subjective: Zachary Lopez presents today preventative diabetic foot care and painful mycotic nails b/l that are difficult to trim. Pain interferes with ambulation. Aggravating factors include wearing enclosed shoe gear. Pain is relieved with periodic professional debridement.  Patient is deaf and has a sign language interpreter accompanying him on today's visit. Zachary Lopez voices no new pedal problems on today's visit.  Raina Mina., MD is patient's PCP.  Past Medical History:  Diagnosis Date  . Arthritis   . Complication of anesthesia   . Hyperlipemia   . PONV (postoperative nausea and vomiting)      Current Outpatient Medications on File Prior to Visit  Medication Sig Dispense Refill  . cetirizine (ZYRTEC) 10 MG tablet TAKE 1 TABLET (10 MG) BY MOUTH DAILY AS NEEDED FOR ALLERGIES    . CRESTOR 10 MG tablet Take 10 mg by mouth 2 (two) times daily.  1  . escitalopram (LEXAPRO) 5 MG tablet TAKE 1 TABLET BY MOUTH EVERY DAY    . metFORMIN (GLUCOPHAGE-XR) 500 MG 24 hr tablet     . rifampin (RIFADIN) 300 MG capsule Take 300 mg by mouth.    . tamsulosin (FLOMAX) 0.4 MG CAPS capsule Take by mouth.     No current facility-administered medications on file prior to visit.     Allergies  Allergen Reactions  . Cefepime Rash and Hives  . Vancomycin Anaphylaxis    Objective: Zachary Lopez is a pleasant 64 y.o. y.o. Patient Race: White or Caucasian [1]  male in NAD. AAO x 3.  There were no vitals filed for this visit.  Vascular Examination: Neurovascular status unchanged b/l. Capillary fill time to digits <3 seconds b/l. Faintly palpable pedal pulses b/l. Pedal hair sparse b/l. Skin temperature gradient within normal limits b/l.  Dermatological Examination: Pedal skin with normal turgor, texture and tone bilaterally. No open wounds bilaterally. No interdigital macerations bilaterally. Toenails 1-5 b/l elongated, dystrophic, thickened, crumbly with subungual debris and tenderness to dorsal  palpation.  Musculoskeletal: Normal muscle strength 5/5 to all lower extremity muscle groups bilaterally. No gross bony deformities bilaterally. No pain crepitus or joint limitation noted with ROM b/l.  Neurological Examination: Protective sensation intact 5/5 intact bilaterally with 10g monofilament b/l. Vibratory sensation intact b/l.  Assessment: 1. Dermatophytosis of nail   2. Diabetes mellitus without complication ([place])    Plan: -Examined patient. -Continue diabetic foot care principles. Literature dispensed on today.  -Toenails 1-5 b/l were debrided in length and girth with sterile nail nippers and dremel without iatrogenic bleeding.  -Patient to continue soft, supportive shoe gear daily. -Patient to report any pedal injuries to medical professional immediately. -Patient/POA to call should there be question/concern in the interim.  Return in about 3 months (around 11/01/2019).  Marzetta Board, DPM

## 2019-08-02 ENCOUNTER — Ambulatory Visit: Payer: PRIVATE HEALTH INSURANCE | Admitting: Sports Medicine

## 2019-08-13 DIAGNOSIS — E1165 Type 2 diabetes mellitus with hyperglycemia: Secondary | ICD-10-CM

## 2019-08-13 HISTORY — DX: Type 2 diabetes mellitus with hyperglycemia: E11.65

## 2019-11-07 ENCOUNTER — Encounter: Payer: Self-pay | Admitting: Podiatry

## 2019-11-07 ENCOUNTER — Other Ambulatory Visit: Payer: Self-pay

## 2019-11-07 ENCOUNTER — Ambulatory Visit (INDEPENDENT_AMBULATORY_CARE_PROVIDER_SITE_OTHER): Payer: PRIVATE HEALTH INSURANCE | Admitting: Podiatry

## 2019-11-07 DIAGNOSIS — F419 Anxiety disorder, unspecified: Secondary | ICD-10-CM | POA: Insufficient documentation

## 2019-11-07 DIAGNOSIS — M79674 Pain in right toe(s): Secondary | ICD-10-CM

## 2019-11-07 DIAGNOSIS — H9193 Unspecified hearing loss, bilateral: Secondary | ICD-10-CM

## 2019-11-07 DIAGNOSIS — M79675 Pain in left toe(s): Secondary | ICD-10-CM | POA: Diagnosis not present

## 2019-11-07 DIAGNOSIS — B351 Tinea unguium: Secondary | ICD-10-CM | POA: Diagnosis not present

## 2019-11-07 DIAGNOSIS — E119 Type 2 diabetes mellitus without complications: Secondary | ICD-10-CM | POA: Diagnosis not present

## 2019-11-07 HISTORY — DX: Anxiety disorder, unspecified: F41.9

## 2019-11-07 NOTE — Progress Notes (Signed)
Subjective: Zachary Lopez presents today preventative diabetic foot care and painful mycotic nails b/l that are difficult to trim. Pain interferes with ambulation. Aggravating factors include wearing enclosed shoe gear. Pain is relieved with periodic professional debridement.  Patient is deaf and has a [place] sign language interpreter, Zachary Lopez, accompanying him on today's visit. Zachary Lopez voices no new pedal problems on today's visit.  Zachary Lopez., MD is patient's PCP. Last visit was April, 2021.   Past Medical History:  Diagnosis Date  . Arthritis   . Complication of anesthesia   . Hyperlipemia   . PONV (postoperative nausea and vomiting)      Current Outpatient Medications on File Prior to Visit  Medication Sig Dispense Refill  . cetirizine (ZYRTEC) 10 MG tablet TAKE 1 TABLET (10 MG) BY MOUTH DAILY AS NEEDED FOR ALLERGIES    . CRESTOR 10 MG tablet Take 10 mg by mouth 2 (two) times daily.  1  . escitalopram (LEXAPRO) 5 MG tablet TAKE 1 TABLET BY MOUTH EVERY DAY    . meloxicam (MOBIC) 7.5 MG tablet Take 7.5 mg by mouth daily.    . metFORMIN (GLUCOPHAGE-XR) 500 MG 24 hr tablet     . rifampin (RIFADIN) 300 MG capsule Take 300 mg by mouth.    . tamsulosin (FLOMAX) 0.4 MG CAPS capsule Take by mouth.     No current facility-administered medications on file prior to visit.     Allergies  Allergen Reactions  . Cefepime Rash and Hives  . Vancomycin Anaphylaxis    Objective: Zachary Lopez is a pleasant 63 y.o.  Caucasian male, WD, WN in NAD. AAO x 3.  There were no vitals filed for this visit.  Vascular Examination: Neurovascular status unchanged b/l. Capillary fill time to digits <3 seconds b/l. Faintly palpable pedal pulses b/l. Pedal hair sparse b/l. Skin temperature gradient within normal limits b/l.  Dermatological Examination: Pedal skin with normal turgor, texture and tone bilaterally. No open wounds bilaterally. No interdigital macerations bilaterally. Toenails 1-5  b/l elongated, dystrophic, thickened, crumbly with subungual debris and tenderness to dorsal palpation.  Musculoskeletal: Normal muscle strength 5/5 to all lower extremity muscle groups bilaterally. No gross bony deformities bilaterally. No pain crepitus or joint limitation noted with ROM b/l.  Neurological Examination: Protective sensation intact 5/5 intact bilaterally with 10g monofilament b/l. Vibratory sensation intact b/l.  Assessment: 1. Pain due to onychomycosis of toenails of both feet   2. Diabetes mellitus without complication ([place])   3. Bilateral deafness    Plan: -Examined patient. -No new findings. No new orders. -Continue diabetic foot care principles. -Toenails 1-5 b/l were debrided in length and girth with sterile nail nippers and dremel without iatrogenic bleeding.  -Patient to report any pedal injuries to medical professional immediately. -Patient to continue soft, supportive shoe gear daily. -Patient/POA to call should there be question/concern in the interim.  Return in about 3 months (around 02/07/2020).  Zachary Lopez, DPM

## 2020-02-20 ENCOUNTER — Other Ambulatory Visit: Payer: Self-pay

## 2020-02-20 ENCOUNTER — Ambulatory Visit: Payer: PRIVATE HEALTH INSURANCE | Admitting: Podiatry

## 2020-02-20 ENCOUNTER — Encounter: Payer: Self-pay | Admitting: Podiatry

## 2020-02-20 DIAGNOSIS — M79675 Pain in left toe(s): Secondary | ICD-10-CM

## 2020-02-20 DIAGNOSIS — M79674 Pain in right toe(s): Secondary | ICD-10-CM | POA: Diagnosis not present

## 2020-02-20 DIAGNOSIS — B351 Tinea unguium: Secondary | ICD-10-CM

## 2020-02-20 DIAGNOSIS — E119 Type 2 diabetes mellitus without complications: Secondary | ICD-10-CM | POA: Diagnosis not present

## 2020-02-25 NOTE — Progress Notes (Signed)
Subjective: Zachary Lopez presents today preventative diabetic foot care and painful mycotic nails b/l that are difficult to trim. Pain interferes with ambulation. Aggravating factors include wearing enclosed shoe gear. Pain is relieved with periodic professional debridement.  Patient is deaf and has a [place] sign language interpreter accompanying him on today's visit. Zachary Lopez voices no new pedal problems on today's visit.  Raina Mina., MD is patient's PCP. Last visit was April, 2021.   Past Medical History:  Diagnosis Date  . Arthritis   . Complication of anesthesia   . Hyperlipemia   . PONV (postoperative nausea and vomiting)      Current Outpatient Medications on File Prior to Visit  Medication Sig Dispense Refill  . cetirizine (ZYRTEC) 10 MG tablet TAKE 1 TABLET (10 MG) BY MOUTH DAILY AS NEEDED FOR ALLERGIES    . CRESTOR 10 MG tablet Take 10 mg by mouth 2 (two) times daily.  1  . escitalopram (LEXAPRO) 5 MG tablet TAKE 1 TABLET BY MOUTH EVERY DAY    . meloxicam (MOBIC) 7.5 MG tablet Take 7.5 mg by mouth daily.    . metFORMIN (GLUCOPHAGE-XR) 500 MG 24 hr tablet     . rifampin (RIFADIN) 300 MG capsule Take 300 mg by mouth.    . tamsulosin (FLOMAX) 0.4 MG CAPS capsule Take by mouth.     No current facility-administered medications on file prior to visit.     Allergies  Allergen Reactions  . Cefepime Rash and Hives  . Vancomycin Anaphylaxis    Objective: Zachary Lopez is a pleasant 64 y.o.  Caucasian male, WD, WN in NAD. AAO x 3.  There were no vitals filed for this visit.  Vascular Examination: Neurovascular status unchanged b/l. Capillary fill time to digits <3 seconds b/l. Faintly palpable pedal pulses b/l. Pedal hair sparse b/l. Skin temperature gradient within normal limits b/l.  Dermatological Examination: Pedal skin with normal turgor, texture and tone bilaterally. No open wounds bilaterally. No interdigital macerations bilaterally. Toenails 1-5 b/l elongated,  dystrophic, thickened, crumbly with subungual debris and tenderness to dorsal palpation.  Musculoskeletal: Normal muscle strength 5/5 to all lower extremity muscle groups bilaterally. No gross bony deformities bilaterally. No pain crepitus or joint limitation noted with ROM b/l.  Neurological Examination: Protective sensation intact 5/5 intact bilaterally with 10g monofilament b/l. Vibratory sensation intact b/l.  Assessment: 1. Pain due to onychomycosis of toenails of both feet   2. Diabetes mellitus without complication ([place])    Plan: -Examined patient. -No new findings. No new orders. -Continue diabetic foot care principles. -Toenails 1-5 b/l were debrided in length and girth with sterile nail nippers and dremel without iatrogenic bleeding.  -Patient to report any pedal injuries to medical professional immediately. -Patient to continue soft, supportive shoe gear daily. -Patient/POA to call should there be question/concern in the interim.  Return in about 3 months (around 05/20/2020) for diabetic foot care.  Marzetta Board, DPM

## 2020-05-21 ENCOUNTER — Ambulatory Visit: Payer: PRIVATE HEALTH INSURANCE | Admitting: Podiatry

## 2020-06-25 DIAGNOSIS — R079 Chest pain, unspecified: Secondary | ICD-10-CM | POA: Insufficient documentation

## 2020-06-25 HISTORY — DX: Chest pain, unspecified: R07.9

## 2020-06-30 ENCOUNTER — Encounter: Payer: Self-pay | Admitting: *Deleted

## 2020-06-30 ENCOUNTER — Encounter: Payer: Self-pay | Admitting: Cardiology

## 2020-07-10 ENCOUNTER — Ambulatory Visit: Payer: PRIVATE HEALTH INSURANCE | Admitting: Cardiology

## 2020-07-10 ENCOUNTER — Other Ambulatory Visit: Payer: Self-pay

## 2020-07-10 ENCOUNTER — Encounter: Payer: Self-pay | Admitting: Cardiology

## 2020-07-10 VITALS — BP 124/78 | HR 101 | Ht 64.0 in | Wt 164.6 lb

## 2020-07-10 DIAGNOSIS — R072 Precordial pain: Secondary | ICD-10-CM | POA: Diagnosis not present

## 2020-07-10 DIAGNOSIS — E782 Mixed hyperlipidemia: Secondary | ICD-10-CM

## 2020-07-10 DIAGNOSIS — E119 Type 2 diabetes mellitus without complications: Secondary | ICD-10-CM | POA: Diagnosis not present

## 2020-07-10 DIAGNOSIS — R9431 Abnormal electrocardiogram [ECG] [EKG]: Secondary | ICD-10-CM

## 2020-07-10 DIAGNOSIS — I10 Essential (primary) hypertension: Secondary | ICD-10-CM

## 2020-07-10 DIAGNOSIS — Z8249 Family history of ischemic heart disease and other diseases of the circulatory system: Secondary | ICD-10-CM

## 2020-07-10 MED ORDER — NITROGLYCERIN 0.4 MG SL SUBL: 0.4000 mg | SUBLINGUAL_TABLET | SUBLINGUAL | 3 refills | Status: DC | PRN

## 2020-07-10 MED ORDER — METOPROLOL TARTRATE 100 MG PO TABS: ORAL_TABLET | ORAL | 0 refills | Status: DC

## 2020-07-10 NOTE — Patient Instructions (Addendum)
Medication Instructions:  Your physician has recommended you make the following change in your medication: START: Nitroglycerin 0.4 mg take one tablet by mouth every 5 minutes up to three times as needed for chest pain.  *If you need a refill on your cardiac medications before your next appointment, please call your pharmacy*   Lab Work: Your physician recommends that you return for lab work: 3-7 days before CT: BMET, Mag, CBC If you have labs (blood work) drawn today and your tests are completely normal, you will receive your results only by: Marland Kitchen MyChart Message (if you have MyChart) OR . A paper copy in the mail If you have any lab test that is abnormal or we need to change your treatment, we will call you to review the results.   Testing/Procedures: Your cardiac CT will be scheduled at:   [facility] [address] [place], [place] [address] [phone]   If scheduled at [facility], please arrive at the [facility] main entrance (entrance A) of [facility] 30 minutes prior to test start time. Proceed to the [facility] Radiology Department (first floor) to check-in and test prep.  Please follow these instructions carefully (unless otherwise directed):    On the Night Before the Test: . Be sure to Drink plenty of water. . Do not consume any caffeinated/decaffeinated beverages or chocolate 12 hours prior to your test. . Do not take any antihistamines 12 hours prior to your test.  On the Day of the Test: . Drink plenty of water until 1 hour prior to the test. . Do not eat any food 4 hours prior to the test. . You may take your regular medications prior to the test.  . Take metoprolol (Lopressor) two hours prior to test.  After the Test: . Drink plenty of water. . After receiving IV contrast, you may experience a mild flushed feeling. This is normal. . On occasion, you may experience a mild rash up to 24 hours after the test. This is not  dangerous. If this occurs, you can take Benadryl 25 mg and increase your fluid intake. . If you experience trouble breathing, this can be serious. If it is severe call 911 IMMEDIATELY. If it is mild, please call our office. . Metformin please do not take 48 hours after completing test unless otherwise instructed.   Once we have confirmed authorization from your insurance company, we will call you to set up a date and time for your test. Based on how quickly your insurance processes prior authorizations requests, please allow up to 4 weeks to be contacted for scheduling your Cardiac CT appointment. Be advised that routine Cardiac CT appointments could be scheduled as many as 8 weeks after your provider has ordered it.  For non-scheduling related questions, please contact the cardiac imaging nurse navigator should you have any questions/concerns: Marchia Bond, Cardiac Imaging Nurse Navigator Gordy Clement, Cardiac Imaging Nurse Navigator [facility] Direct Office Dial: [phone]   For scheduling needs, including cancellations and rescheduling, please call [place], [phone].     Follow-Up: At [facility], you and your health needs are our priority.  As part of our continuing mission to provide you with exceptional heart care, we have created designated Provider Care Teams.  These Care Teams include your primary Cardiologist (physician) and Advanced Practice Providers (APPs -  Physician Assistants and Nurse Practitioners) who all work together to provide you with the care you need, when you need  it.  We recommend signing up for the patient portal called "MyChart".  Sign up information is provided on this After Visit Summary.  MyChart is used to connect with patients for Virtual Visits (Telemedicine).  Patients are able to view lab/test results, encounter notes, upcoming appointments, etc.  Non-urgent messages can be sent to your provider as well.   To learn  more about what you can do with MyChart, go to [email or web address].    Your next appointment:   3 month(s)  The format for your next appointment:   In Person  Provider:   Berniece Salines, DO   Other Instructions

## 2020-07-10 NOTE — Progress Notes (Signed)
Cardiology Office Note:    Date:  07/10/2020   ID:  Faith Rogue, DOB 03-11-1956, MRN [id number]  PCP:  Raina Mina., MD  Cardiologist:  Berniece Salines, DO  Electrophysiologist:  None   Referring MD: Gweneth Fritter, FNP   I have been experiencing chest pain  History of Present Illness:    Zachary Lopez is a 65 y.o. male with a hx of diabetes mellitus, hyperlipidemia, hypertension, family history of premature coronary artery disease who is here today to be evaluated for intermittent chest pressure.  Our visit today was interpreted by an in person sign language interpreter.  The patient said he was fine up until 3 weeks ago when he started experience significant chest pressure after breaking up with his girlfriend.  He notes that it is intermittent pressure sensation which sometimes squeezing burning or squeezing.  It last for few minutes and then resolved.  Nothing makes it better or worse.  Past Medical History:  Diagnosis Date  . Anxiety 11/07/2019  . Arthritis   . Benign prostatic hyperplasia with urinary frequency 11/27/2017  . Cellulitis   . Chest pain at rest 06/25/2020  . Complication of anesthesia   . Contracture of knee joint 12/21/2011  . Controlled type 2 diabetes mellitus with hyperglycemia, without long-term current use of insulin ([place]) 08/13/2019  . Deaf, bilateral 01/28/2016   Formatting of this note might be different from the original. Overview:  history of meningitis as a child Formatting of this note might be different from the original. history of meningitis as a child  . Essential hypertension 03/29/2016   Formatting of this note might be different from the original. Overview:  monitoring at present per PCP, not on meds at present Formatting of this note might be different from the original. monitoring at present per PCP, not on meds at present  . High risk medication use 03/29/2016  . Hyperlipemia   . Hyperlipidemia 01/28/2016  . Infection of prosthetic left knee joint  ([place]) 12/11/2010  . Knee pain 12/21/2011  . Malaise and fatigue 11/27/2017  . Meningitis    As a child which caused deafness  . Mild episode of recurrent major depressive disorder ([place]) 11/27/2017  . Mute 04/04/2017  . PONV (postoperative nausea and vomiting)   . Prediabetes 03/29/2016  . Primary osteoarthritis of right hip 07/08/2014  . Seasonal allergic rhinitis due to pollen 06/24/2019    Past Surgical History:  Procedure Laterality Date  . APPENDECTOMY    . CHOLECYSTECTOMY    . COLONOSCOPY    . DENTAL SURGERY    . ELBOW SURGERY Left   . IRRIGATION AND DEBRIDEMENT KNEE Left 12/11/2010   Surgeon: Chong Sicilian, MD; [facility] Main OR  . KNEE ARTHROPLASTY Left   . TOTAL HIP ARTHROPLASTY Right 07/08/2014   Procedure: RIGHT  TOTAL HIP ARTHROPLASTY ANTERIOR APPROACH;  Surgeon: Melrose Nakayama, MD;  Location: [facility];  Service: Orthopedics;  Laterality: Right;    Current Medications: Current Meds  Medication Sig  . cetirizine (ZYRTEC) 10 MG tablet Take 10 mg by mouth daily.  . CRESTOR 10 MG tablet Take 20 mg by mouth at bedtime.  Marland Kitchen escitalopram (LEXAPRO) 5 MG tablet Take 5 mg by mouth daily.  . metFORMIN (GLUCOPHAGE-XR) 500 MG 24 hr tablet Take 500 mg by mouth daily with breakfast.  . tamsulosin (FLOMAX) 0.4 MG CAPS capsule Take 0.4 mg by mouth daily.     Allergies:   Cefepime and Vancomycin   Social History   Socioeconomic  History  . Marital status: Divorced    Spouse name: Not on file  . Number of children: Not on file  . Years of education: Not on file  . Highest education level: Not on file  Occupational History  . Not on file  Tobacco Use  . Smoking status: Never Smoker  . Smokeless tobacco: Never Used  Substance and Sexual Activity  . Alcohol use: No  . Drug use: No  . Sexual activity: Not on file  Other Topics Concern  . Not on file  Social History Narrative  . Not on file   Social Determinants of Health   Financial Resource Strain: Not on file  Food Insecurity:  Not on file  Transportation Needs: Not on file  Physical Activity: Not on file  Stress: Not on file  Social Connections: Not on file     Family History: The patient's family history includes Cancer in his mother; Heart disease in his father; Heart failure in his father.  ROS:   Review of Systems  Constitution: Negative for decreased appetite, fever and weight gain.  HENT: Negative for congestion, ear discharge, hoarse voice and sore throat.   Eyes: Negative for discharge, redness, vision loss in right eye and visual halos.  Cardiovascular: Negative for chest pain, dyspnea on exertion, leg swelling, orthopnea and palpitations.  Respiratory: Negative for cough, hemoptysis, shortness of breath and snoring.   Endocrine: Negative for heat intolerance and polyphagia.  Hematologic/Lymphatic: Negative for bleeding problem. Does not bruise/bleed easily.  Skin: Negative for flushing, nail changes, rash and suspicious lesions.  Musculoskeletal: Negative for arthritis, joint pain, muscle cramps, myalgias, neck pain and stiffness.  Gastrointestinal: Negative for abdominal pain, bowel incontinence, diarrhea and excessive appetite.  Genitourinary: Negative for decreased libido, genital sores and incomplete emptying.  Neurological: Negative for brief paralysis, focal weakness, headaches and loss of balance.  Psychiatric/Behavioral: Negative for altered mental status, depression and suicidal ideas.  Allergic/Immunologic: Negative for HIV exposure and persistent infections.    EKGs/Labs/Other Studies Reviewed:    The following studies were reviewed today:   EKG:  The ekg ordered today demonstrates   Recent Labs: No results found for requested labs within last 8760 hours.  Recent Lipid Panel No results found for: CHOL, TRIG, HDL, CHOLHDL, VLDL, LDLCALC, LDLDIRECT  Physical Exam:    VS:  BP 124/78 (BP Location: Right Arm)   Pulse (!) 101   Ht 5\' 4"  (1.626 m)   Wt 164 lb 9.6 oz (74.7 kg)    SpO2 97%   BMI 28.25 kg/m     Wt Readings from Last 3 Encounters:  07/10/20 164 lb 9.6 oz (74.7 kg)  06/25/20 166 lb (75.3 kg)  07/08/14 161 lb 3.2 oz (73.1 kg)     GEN: Well nourished, well developed in no acute distress HEENT: Normal NECK: No JVD; No carotid bruits LYMPHATICS: No lymphadenopathy CARDIAC: S1S2 noted,RRR, no murmurs, rubs, gallops RESPIRATORY:  Clear to auscultation without rales, wheezing or rhonchi  ABDOMEN: Soft, non-tender, non-distended, +bowel sounds, no guarding. EXTREMITIES: No edema, No cyanosis, no clubbing MUSCULOSKELETAL:  No deformity  SKIN: Warm and dry NEUROLOGIC:  Alert and oriented x 3, non-focal PSYCHIATRIC:  Normal affect, good insight  ASSESSMENT:    1. Precordial pain   2. Type 2 diabetes mellitus without complication, without long-term current use of insulin ([place])   3. Abnormal EKG   4. Mixed hyperlipidemia   5. Family history of premature CAD   6. Hypertension, unspecified type  PLAN:     The symptom of chest pain with his risk factors, diabetes, hyperlipidemia, hypertension, premature family history of coronary artery disease are concerning, this patient does have intermediate risk for coronary artery disease and at this time I would like to pursue an ischemic evaluation in this patient.  Shared decision a coronary CTA at this time is appropriate.  I have discussed with the patient about the testing.  The patient has no IV contrast allergy and is agreeable to proceed with this test.  Sublingual nitroglycerin prescription was sent, its protocol and 911 protocol explained and the patient vocalized understanding questions were answered to the patient's satisfaction  Hyperlipidemia - continue with current statin medication.  Blood pressure is acceptable, continue with current antihypertensive regimen.  This is being managed by his primary care doctor.  No adjustments for antidiabetic medications were made today.   The patient is in  agreement with the above plan. The patient left the office in stable condition.  The patient will follow up in 3 months or sooner if needed.   Medication Adjustments/Labs and Tests Ordered: Current medicines are reviewed at length with the patient today.  Concerns regarding medicines are outlined above.  No orders of the defined types were placed in this encounter.  No orders of the defined types were placed in this encounter.   There are no Patient Instructions on file for this visit.   Adopting a Healthy Lifestyle.  Know what a healthy weight is for you (roughly BMI <25) and aim to maintain this   Aim for 7+ servings of fruits and vegetables daily   65-80+ fluid ounces of water or unsweet tea for healthy kidneys   Limit to max 1 drink of alcohol per day; avoid smoking/tobacco   Limit animal fats in diet for cholesterol and heart health - choose grass fed whenever available   Avoid highly processed foods, and foods high in saturated/trans fats   Aim for low stress - take time to unwind and care for your mental health   Aim for 150 min of moderate intensity exercise weekly for heart health, and weights twice weekly for bone health   Aim for 7-9 hours of sleep daily   When it comes to diets, agreement about the perfect plan isnt easy to find, even among the experts. Experts at the [place] developed an idea known as the Healthy Eating Plate. Just imagine a plate divided into logical, healthy portions.   The emphasis is on diet quality:   Load up on vegetables and fruits - one-half of your plate: Aim for color and variety, and remember that potatoes dont count.   Go for whole grains - one-quarter of your plate: Whole wheat, barley, wheat berries, quinoa, oats, brown rice, and foods made with them. If you want pasta, go with whole wheat pasta.   Protein power - one-quarter of your plate: Fish, chicken, beans, and nuts are all healthy, versatile protein  sources. Limit red meat.   The diet, however, does go beyond the plate, offering a few other suggestions.   Use healthy plant oils, such as olive, canola, soy, corn, sunflower and peanut. Check the labels, and avoid partially hydrogenated oil, which have unhealthy trans fats.   If youre thirsty, drink water. Coffee and tea are good in moderation, but skip sugary drinks and limit milk and dairy products to one or two daily servings.   The type of carbohydrate in the diet is more important  than the amount. Some sources of carbohydrates, such as vegetables, fruits, whole grains, and beans-are healthier than others.   Finally, stay active  Signed, Berniece Salines, DO  07/10/2020 3:53 PM    [facility]

## 2020-07-10 NOTE — Addendum Note (Signed)
Addended by: Orvan July on: 07/10/2020 04:09 PM   Modules accepted: Orders

## 2020-07-17 ENCOUNTER — Telehealth (HOSPITAL_COMMUNITY): Payer: Self-pay | Admitting: *Deleted

## 2020-07-17 NOTE — Telephone Encounter (Signed)
Reaching out to patient to offer assistance regarding upcoming cardiac imaging study; pt's sister verbalizes understanding of appt date/time, parking situation and where to check in, pre-test NPO status and medications ordered, and verified current allergies; name and call back number provided for further questions should they arise  Zachary Clement RN Navigator Cardiac Imaging [facility] [phone] office [phone] cell  Pt to take 100mg  metoprolol tartrate 2 hours prior to cardiac CT scan.

## 2020-07-17 NOTE — Progress Notes (Signed)
Called to confirm that an ASL interpreter will be coming to appointment on Monday 5/9. Information reviewed and there will be someone coming. Nothing further needed at this time.  Number for interpretation services is [phone]

## 2020-07-20 ENCOUNTER — Other Ambulatory Visit: Payer: Self-pay

## 2020-07-20 ENCOUNTER — Ambulatory Visit (HOSPITAL_COMMUNITY)
Admission: RE | Admit: 2020-07-20 | Discharge: 2020-07-20 | Disposition: A | Discharge status: Home / Self Care | Payer: Medicare HMO | Source: Ambulatory Visit | Attending: Cardiology | Admitting: Cardiology

## 2020-07-20 ENCOUNTER — Other Ambulatory Visit (HOSPITAL_COMMUNITY): Payer: Self-pay | Admitting: Internal Medicine

## 2020-07-20 ENCOUNTER — Other Ambulatory Visit: Payer: Self-pay | Admitting: Internal Medicine

## 2020-07-20 DIAGNOSIS — R931 Abnormal findings on diagnostic imaging of heart and coronary circulation: Secondary | ICD-10-CM

## 2020-07-20 DIAGNOSIS — R072 Precordial pain: Secondary | ICD-10-CM | POA: Diagnosis not present

## 2020-07-20 DIAGNOSIS — I2510 Atherosclerotic heart disease of native coronary artery without angina pectoris: Secondary | ICD-10-CM

## 2020-07-20 MED ORDER — IOHEXOL 350 MG/ML SOLN
95.0000 mL | Freq: Once | INTRAVENOUS | Status: AC | PRN
  Administered 2020-07-20: 95 mL via INTRAVENOUS

## 2020-07-20 MED ORDER — NITROGLYCERIN 0.4 MG SL SUBL
SUBLINGUAL_TABLET | SUBLINGUAL | Status: AC
  Administered 2020-07-20: 0.8 mg via SUBLINGUAL
  Filled 2020-07-20: qty 2

## 2020-07-20 MED ORDER — NITROGLYCERIN 0.4 MG SL SUBL: 0.8000 mg | SUBLINGUAL_TABLET | Freq: Once | SUBLINGUAL | Status: AC

## 2020-07-21 ENCOUNTER — Ambulatory Visit (HOSPITAL_COMMUNITY)
Admission: RE | Admit: 2020-07-21 | Discharge: 2020-07-21 | Disposition: A | Discharge status: Home / Self Care | Payer: Medicare HMO | Source: Ambulatory Visit | Attending: Internal Medicine | Admitting: Internal Medicine

## 2020-07-21 DIAGNOSIS — R931 Abnormal findings on diagnostic imaging of heart and coronary circulation: Secondary | ICD-10-CM | POA: Insufficient documentation

## 2020-07-21 DIAGNOSIS — I2510 Atherosclerotic heart disease of native coronary artery without angina pectoris: Secondary | ICD-10-CM | POA: Diagnosis not present

## 2020-07-24 ENCOUNTER — Other Ambulatory Visit: Payer: Self-pay

## 2020-07-24 ENCOUNTER — Telehealth: Payer: Self-pay | Admitting: Cardiology

## 2020-07-24 MED ORDER — ASPIRIN EC 81 MG PO TBEC: 81.0000 mg | DELAYED_RELEASE_TABLET | Freq: Every day | ORAL | 3 refills | Status: DC

## 2020-07-24 MED ORDER — ROSUVASTATIN CALCIUM 40 MG PO TABS: 40.0000 mg | ORAL_TABLET | Freq: Every day | ORAL | 3 refills | Status: DC

## 2020-07-24 NOTE — Telephone Encounter (Signed)
Completed by [facility]

## 2020-07-24 NOTE — Progress Notes (Signed)
Prescription sent to pharmacy.

## 2020-07-24 NOTE — Telephone Encounter (Signed)
Pt is returning a call from earlier this morning. Pt  States the phone rang one time and hung up and not VM was left so he's not sure who called.

## 2020-09-03 ENCOUNTER — Encounter: Payer: Self-pay | Admitting: Podiatry

## 2020-09-03 ENCOUNTER — Other Ambulatory Visit: Payer: Self-pay

## 2020-09-03 ENCOUNTER — Ambulatory Visit: Payer: Medicare HMO | Admitting: Podiatry

## 2020-09-03 DIAGNOSIS — E119 Type 2 diabetes mellitus without complications: Secondary | ICD-10-CM | POA: Diagnosis not present

## 2020-09-03 DIAGNOSIS — M79674 Pain in right toe(s): Secondary | ICD-10-CM

## 2020-09-03 DIAGNOSIS — B351 Tinea unguium: Secondary | ICD-10-CM

## 2020-09-03 DIAGNOSIS — M79675 Pain in left toe(s): Secondary | ICD-10-CM

## 2020-09-03 NOTE — Patient Instructions (Signed)

## 2020-09-06 NOTE — Progress Notes (Signed)
  Subjective:  Patient ID: Zachary Lopez, male    DOB: 01/17/1956,  MRN: [id number]  65 y.o. male presents with preventative diabetic foot care and thick, elongated toenails b/l feet which are tender when wearing enclosed shoe gear.  He is deaf and is accompanied by a sign language interpreter, Zachary Lopez, on today's visit. Patient states he is not diabetic, but chart review reveals diagnosis and last A1c of 7.8%. Patient does not monitor blood glucose on a daily basis.  PCP: Zachary Fritter, FNP and last visit was: 06/26/2020.  Review of Systems: Negative except as noted in the HPI.   Allergies  Allergen Reactions   Cefepime Rash and Hives   Vancomycin Anaphylaxis    Objective:  There were no vitals filed for this visit. Constitutional Patient is a pleasant 65 y.o. Caucasian male WD, WN in NAD. AAO x 3.  Vascular Capillary fill time to digits <3 seconds b/l lower extremities. Faintly palpable DP Lopez(s) b/l lower extremities. Faintly palpable PT Lopez(s) b/l lower extremities. Pedal hair sparse. Lower extremity skin temperature gradient within normal limits. No pain with calf compression b/l. No edema noted b/l lower extremities. No cyanosis or clubbing noted.  Neurologic Normal speech. Protective sensation intact 5/5 intact bilaterally with 10g monofilament b/l. Vibratory sensation intact b/l.  Dermatologic Pedal skin with normal turgor, texture and tone b/l lower extremities No open wounds b/l lower extremities No interdigital macerations b/l lower extremities Toenails 1-5 right, L hallux, L 3rd toe, L 4th toe, and L 5th toe elongated, discolored, dystrophic, thickened, and crumbly with subungual debris and tenderness to dorsal palpation. Anonychia noted L 2nd toe. Nailbed(s) epithelialized.  There is evidence of subacute subungual hematoma of the L 3rd toe. Nailplate remains adhered. There is no  tenderness to palpation.  Orthopedic: Normal muscle strength 5/5 to all lower extremity muscle groups  bilaterally. No pain crepitus or joint limitation noted with ROM b/l. No gross bony deformities bilaterally.   Assessment:   1. Pain due to onychomycosis of toenails of both feet   2. Diabetes mellitus without complication (Zachary Lopez)    Plan:  Patient was evaluated and treated and all questions answered.  Onychomycosis with pain -Nails palliatively debridement as below. -Educated on self-care  Procedure: Nail Debridement Rationale: Pain Type of Debridement: manual, sharp debridement. Instrumentation: Nail nipper, rotary burr. Number of Nails: 10  -Examined patient. -Continue diabetic foot care principles. -Patient to continue soft, supportive shoe gear daily. -Toenails 1-5 right, L hallux, L 3rd toe, L 4th toe, and L 5th toe debrided in length and girth without iatrogenic bleeding with sterile nail nipper and dremel.  -Patient to report any pedal injuries to medical professional immediately. -Patient/POA to call should there be question/concern in the interim.  Return in about 3 months (around 12/04/2020).  Marzetta Board, DPM

## 2020-10-05 DIAGNOSIS — M1990 Unspecified osteoarthritis, unspecified site: Secondary | ICD-10-CM | POA: Insufficient documentation

## 2020-10-05 DIAGNOSIS — R112 Nausea with vomiting, unspecified: Secondary | ICD-10-CM | POA: Insufficient documentation

## 2020-10-05 DIAGNOSIS — G039 Meningitis, unspecified: Secondary | ICD-10-CM | POA: Insufficient documentation

## 2020-10-05 DIAGNOSIS — T8859XA Other complications of anesthesia, initial encounter: Secondary | ICD-10-CM | POA: Insufficient documentation

## 2020-10-05 DIAGNOSIS — Z9889 Other specified postprocedural states: Secondary | ICD-10-CM | POA: Insufficient documentation

## 2020-10-05 DIAGNOSIS — L0390 Cellulitis, unspecified: Secondary | ICD-10-CM | POA: Insufficient documentation

## 2020-10-13 ENCOUNTER — Ambulatory Visit: Payer: Medicare HMO | Admitting: Cardiology

## 2020-10-13 ENCOUNTER — Encounter: Payer: Self-pay | Admitting: Cardiology

## 2020-10-13 ENCOUNTER — Other Ambulatory Visit: Payer: Self-pay

## 2020-10-13 VITALS — BP 142/80 | HR 83 | Ht 64.0 in | Wt 168.6 lb

## 2020-10-13 DIAGNOSIS — R030 Elevated blood-pressure reading, without diagnosis of hypertension: Secondary | ICD-10-CM | POA: Diagnosis not present

## 2020-10-13 DIAGNOSIS — I2510 Atherosclerotic heart disease of native coronary artery without angina pectoris: Secondary | ICD-10-CM | POA: Diagnosis not present

## 2020-10-13 DIAGNOSIS — E119 Type 2 diabetes mellitus without complications: Secondary | ICD-10-CM | POA: Diagnosis not present

## 2020-10-13 DIAGNOSIS — E663 Overweight: Secondary | ICD-10-CM

## 2020-10-13 DIAGNOSIS — E782 Mixed hyperlipidemia: Secondary | ICD-10-CM | POA: Diagnosis not present

## 2020-10-13 NOTE — Patient Instructions (Signed)
Medication Instructions:  Your physician recommends that you continue on your current medications as directed. Please refer to the Current Medication list given to you today.  *If you need a refill on your cardiac medications before your next appointment, please call your pharmacy*   Lab Work: None If you have labs (blood work) drawn today and your tests are completely normal, you will receive your results only by: [place] (if you have MyChart) OR A paper copy in the mail If you have any lab test that is abnormal or we need to change your treatment, we will call you to review the results.   Testing/Procedures: None   Follow-Up: At [facility], you and your health needs are our priority.  As part of our continuing mission to provide you with exceptional heart care, we have created designated Provider Care Teams.  These Care Teams include your primary Cardiologist (physician) and Advanced Practice Providers (APPs -  Physician Assistants and Nurse Practitioners) who all work together to provide you with the care you need, when you need it.  We recommend signing up for the patient portal called "MyChart".  Sign up information is provided on this After Visit Summary.  MyChart is used to connect with patients for Virtual Visits (Telemedicine).  Patients are able to view lab/test results, encounter notes, upcoming appointments, etc.  Non-urgent messages can be sent to your provider as well.   To learn more about what you can do with MyChart, go to [email or web address].    Your next appointment:   1 year(s)  The format for your next appointment:   In Person  Provider:   Dr. Harriet Masson   Other Instructions Please take your blood pressure daily and send [place] a MyChart message or call us with these readings in about two weeks.

## 2020-10-13 NOTE — Progress Notes (Signed)
Cardiology Office Note:    Date:  10/13/2020   ID:  Zachary Lopez, DOB 1956/03/08, MRN [id number]  PCP:  Gweneth Fritter, FNP  Cardiologist:  Berniece Salines, DO  Electrophysiologist:  None   Referring MD: Raina Mina., MD   Chief Complaint  Patient presents with   Results    History of Present Illness:    Zachary Lopez is a 65 y.o. male with a hx of diabetes mellitus, hyperlipidemia, hypertension, nonobstructive coronary artery disease on coronary CTA is here today for follow-up visit.  I last saw the patient on July 10, 2020 at that time he was having chest discomfort he did come with his sister given his risk factors we recommended he undergo a coronary CTA.  He had his CTA which showed none obstructive coronary artery disease.  He tells me he is doing well.  He offers no complaints today.  This visit was facilitated by an interpreter.  Past Medical History:  Diagnosis Date   Anxiety 11/07/2019   Arthritis    Benign prostatic hyperplasia with urinary frequency 11/27/2017   Cellulitis    Chest pain at rest [id number]   Complication of anesthesia    Contracture of knee joint 12/21/2011   Controlled type 2 diabetes mellitus with hyperglycemia, without long-term current use of insulin ([place]) 08/13/2019   Deaf, bilateral 01/28/2016   Formatting of this note might be different from the original. Overview:  history of meningitis as a child Formatting of this note might be different from the original. history of meningitis as a child   Essential hypertension 03/29/2016   Formatting of this note might be different from the original. Overview:  monitoring at present per PCP, not on meds at present Formatting of this note might be different from the original. 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   Knee pain 12/21/2011   Malaise and fatigue  11/27/2017   Meningitis    As a child which caused deafness   Mild episode of recurrent major depressive disorder ([place]) 11/27/2017   Mute 04/04/2017   PONV (postoperative nausea and vomiting)    Prediabetes 03/29/2016   Primary osteoarthritis of right hip 07/08/2014   Seasonal allergic rhinitis due to pollen 06/24/2019    Past Surgical History:  Procedure Laterality Date   APPENDECTOMY     CHOLECYSTECTOMY     COLONOSCOPY     DENTAL SURGERY     ELBOW SURGERY Left    IRRIGATION AND DEBRIDEMENT KNEE Left 12/11/2010   Surgeon: Chong Sicilian, MD; [facility] OR   KNEE ARTHROPLASTY Left    TOTAL HIP ARTHROPLASTY Right 07/08/2014   Procedure: RIGHT  TOTAL HIP ARTHROPLASTY ANTERIOR APPROACH;  Surgeon: Melrose Nakayama, MD;  Location: [facility];  Service: Orthopedics;  Laterality: Right;    Current Medications: Current Meds  Medication Sig   aspirin EC 81 MG tablet Take 1 tablet (81 mg total) by mouth daily. Swallow whole.   cetirizine (ZYRTEC) 10 MG tablet Take 10 mg by mouth daily.   escitalopram (LEXAPRO) 5 MG tablet Take 5 mg by mouth daily.   metFORMIN (GLUCOPHAGE-XR) 500 MG 24 hr tablet Take 500 mg by mouth daily with breakfast.   nitroGLYCERIN (NITROSTAT) 0.4 MG SL tablet Place 1 tablet (0.4 mg total) under the tongue every 5 (five) minutes as needed for chest pain.  rosuvastatin (CRESTOR) 40 MG tablet Take 1 tablet (40 mg total) by mouth daily.   tamsulosin (FLOMAX) 0.4 MG CAPS capsule Take 0.4 mg by mouth daily.     Allergies:   Cefepime and Vancomycin   Social History   Socioeconomic History   Marital status: Divorced    Spouse name: Not on file   Number of children: Not on file   Years of education: Not on file   Highest education level: Not on file  Occupational History   Not on file  Tobacco Use   Smoking status: Never   Smokeless tobacco: Never  Substance and Sexual Activity   Alcohol use: No   Drug use: No   Sexual activity: Not on file  Other Topics Concern   Not  on file  Social History Narrative   Not on file   Social Determinants of Health   Financial Resource Strain: Not on file  Food Insecurity: Not on file  Transportation Needs: Not on file  Physical Activity: Not on file  Stress: Not on file  Social Connections: Not on file     Family History: The patient's family history includes Cancer in his mother; Heart disease in his father; Heart failure in his father.  ROS:   Review of Systems  Constitution: Negative for decreased appetite, fever and weight gain.  HENT: Negative for congestion, ear discharge, hoarse voice and sore throat.   Eyes: Negative for discharge, redness, vision loss in right eye and visual halos.  Cardiovascular: Negative for chest pain, dyspnea on exertion, leg swelling, orthopnea and palpitations.  Respiratory: Negative for cough, hemoptysis, shortness of breath and snoring.   Endocrine: Negative for heat intolerance and polyphagia.  Hematologic/Lymphatic: Negative for bleeding problem. Does not bruise/bleed easily.  Skin: Negative for flushing, nail changes, rash and suspicious lesions.  Musculoskeletal: Negative for arthritis, joint pain, muscle cramps, myalgias, neck pain and stiffness.  Gastrointestinal: Negative for abdominal pain, bowel incontinence, diarrhea and excessive appetite.  Genitourinary: Negative for decreased libido, genital sores and incomplete emptying.  Neurological: Negative for brief paralysis, focal weakness, headaches and loss of balance.  Psychiatric/Behavioral: Negative for altered mental status, depression and suicidal ideas.  Allergic/Immunologic: Negative for HIV exposure and persistent infections.    EKGs/Labs/Other Studies Reviewed:    The following studies were reviewed today:   EKG: None today  Recent Labs: No results found for requested labs within last 8760 hours.  Recent Lipid Panel No results found for: CHOL, TRIG, HDL, CHOLHDL, VLDL, LDLCALC, LDLDIRECT  Physical  Exam:    VS:  BP (!) 142/80 (BP Location: Left Arm, Patient Position: Sitting)   Pulse 83   Ht '5\' 4"'$  (1.626 m)   Wt 168 lb 9.6 oz (76.5 kg)   SpO2 94%   BMI 28.94 kg/m     Wt Readings from Last 3 Encounters:  10/13/20 168 lb 9.6 oz (76.5 kg)  07/10/20 164 lb 9.6 oz (74.7 kg)  06/25/20 166 lb (75.3 kg)     GEN: Well nourished, well developed in no acute distress HEENT: Normal NECK: No JVD; No carotid bruits LYMPHATICS: No lymphadenopathy CARDIAC: S1S2 noted,RRR, no murmurs, rubs, gallops RESPIRATORY:  Clear to auscultation without rales, wheezing or rhonchi  ABDOMEN: Soft, non-tender, non-distended, +bowel sounds, no guarding. EXTREMITIES: No edema, No cyanosis, no clubbing MUSCULOSKELETAL:  No deformity  SKIN: Warm and dry NEUROLOGIC:  Alert and oriented x 3, non-focal PSYCHIATRIC:  Normal affect, good insight  ASSESSMENT:    1. Coronary artery disease involving  native coronary artery of native heart without angina pectoris   2. Elevated blood pressure reading   3. Mixed hyperlipidemia   4. Type 2 diabetes mellitus without complication, without long-term current use of insulin ([place])   5. Overweight (BMI 25.0-29.9)    PLAN:     We discussed his CT scan results.  He has no questions at this time.  He will continue his aspirin and statin.  No chest pain.  His blood pressure is slightly elevated in the office today.  This is an isolated elevated reading.  I have asked the patient and her sister who was present to take his blood pressure daily and send me this information for the next week.  Should this still be elevated.  Plan to start the patient on low-dose angiotensin receptor blockers.  His diabetes has been managed by his primary care doctor.   The patient is in agreement with the above plan. The patient left the office in stable condition.  The patient will follow up in 12 months he plans to follow-up with me at [facility] my office.   Medication Adjustments/Labs and  Tests Ordered: Current medicines are reviewed at length with the patient today.  Concerns regarding medicines are outlined above.  No orders of the defined types were placed in this encounter.  No orders of the defined types were placed in this encounter.   Patient Instructions  Medication Instructions:  Your physician recommends that you continue on your current medications as directed. Please refer to the Current Medication list given to you today.  *If you need a refill on your cardiac medications before your next appointment, please call your pharmacy*   Lab Work: None If you have labs (blood work) drawn today and your tests are completely normal, you will receive your results only by: [place] (if you have MyChart) OR A paper copy in the mail If you have any lab test that is abnormal or we need to change your treatment, we will call you to review the results.   Testing/Procedures: None   Follow-Up: At [facility], you and your health needs are our priority.  As part of our continuing mission to provide you with exceptional heart care, we have created designated Provider Care Teams.  These Care Teams include your primary Cardiologist (physician) and Advanced Practice Providers (APPs -  Physician Assistants and Nurse Practitioners) who all work together to provide you with the care you need, when you need it.  We recommend signing up for the patient portal called "MyChart".  Sign up information is provided on this After Visit Summary.  MyChart is used to connect with patients for Virtual Visits (Telemedicine).  Patients are able to view lab/test results, encounter notes, upcoming appointments, etc.  Non-urgent messages can be sent to your provider as well.   To learn more about what you can do with MyChart, go to [email or web address].    Your next appointment:   1 year(s)  The format for your next appointment:   In Person  Provider:   Dr. Harriet Masson   Other  Instructions Please take your blood pressure daily and send [place] a MyChart message or call us with these readings in about two weeks.    Adopting a Healthy Lifestyle.  Know what a healthy weight is for you (roughly BMI <25) and aim to maintain this   Aim for 7+ servings of fruits and vegetables daily   65-80+ fluid ounces of water or unsweet tea for healthy kidneys  Limit to max 1 drink of alcohol per day; avoid smoking/tobacco   Limit animal fats in diet for cholesterol and heart health - choose grass fed whenever available   Avoid highly processed foods, and foods high in saturated/trans fats   Aim for low stress - take time to unwind and care for your mental health   Aim for 150 min of moderate intensity exercise weekly for heart health, and weights twice weekly for bone health   Aim for 7-9 hours of sleep daily   When it comes to diets, agreement about the perfect plan isnt easy to find, even among the experts. Experts at the [employer] developed an idea known as the Healthy Eating Plate. Just imagine a plate divided into logical, healthy portions.   The emphasis is on diet quality:   Load up on vegetables and fruits - one-half of your plate: Aim for color and variety, and remember that potatoes dont count.   Go for whole grains - one-quarter of your plate: Whole wheat, barley, wheat berries, quinoa, oats, brown rice, and foods made with them. If you want pasta, go with whole wheat pasta.   Protein power - one-quarter of your plate: Fish, chicken, beans, and nuts are all healthy, versatile protein sources. Limit red meat.   The diet, however, does go beyond the plate, offering a few other suggestions.   Use healthy plant oils, such as olive, canola, soy, corn, sunflower and peanut. Check the labels, and avoid partially hydrogenated oil, which have unhealthy trans fats.   If youre thirsty, drink water. Coffee and tea are good in moderation, but skip  sugary drinks and limit milk and dairy products to one or two daily servings.   The type of carbohydrate in the diet is more important than the amount. Some sources of carbohydrates, such as vegetables, fruits, whole grains, and beans-are healthier than others.   Finally, stay active  Signed, Berniece Salines, DO  10/13/2020 4:03 PM    [facility]

## 2020-10-20 ENCOUNTER — Telehealth: Payer: Self-pay | Admitting: Cardiology

## 2020-10-20 NOTE — Telephone Encounter (Signed)
New message:    Patient dropped off results for BP on 10/19/20. Patient sister would like to know if the doctor received it.

## 2020-10-21 NOTE — Telephone Encounter (Signed)
Follow Up:   Sister is calling back again. She wants to know if Dr Harriet Masson have received and reviewed patient's blood pressure readings and decided what  is the next step please.

## 2020-10-22 ENCOUNTER — Other Ambulatory Visit: Payer: Self-pay

## 2020-10-22 MED ORDER — LOSARTAN POTASSIUM 25 MG PO TABS: 25.0000 mg | ORAL_TABLET | Freq: Every day | ORAL | 3 refills | Status: DC

## 2020-10-22 NOTE — Progress Notes (Signed)
Prescription sent to pharmacy.

## 2020-10-22 NOTE — Telephone Encounter (Signed)
Called patient's sister. She was made aware of Dr. Terrial Rhodes recommendations based off the blood pressure readings they dropped off at the office. Losartan 25 mg daily sent to pharmacy. She thanked me for calling her back.

## 2020-12-09 ENCOUNTER — Other Ambulatory Visit: Payer: Self-pay | Admitting: Urology

## 2020-12-17 ENCOUNTER — Ambulatory Visit: Payer: Medicare HMO | Admitting: Podiatry

## 2020-12-24 ENCOUNTER — Other Ambulatory Visit: Payer: Self-pay

## 2020-12-24 ENCOUNTER — Ambulatory Visit: Payer: Medicare HMO | Admitting: Podiatry

## 2020-12-24 ENCOUNTER — Encounter: Payer: Self-pay | Admitting: Podiatry

## 2020-12-24 DIAGNOSIS — M79609 Pain in unspecified limb: Secondary | ICD-10-CM | POA: Diagnosis not present

## 2020-12-24 DIAGNOSIS — B351 Tinea unguium: Secondary | ICD-10-CM

## 2020-12-24 NOTE — Progress Notes (Signed)
  Subjective:  Patient ID: Zachary Lopez, male    DOB: 07/01/1955,  MRN: [id number]  Chief Complaint  Patient presents with   Nail Problem    Trim nails     65 y.o. male presents with the above complaint. History confirmed with patient. Nails are thick and painful and he can't cut them himself.  Objective:  Physical Exam: warm, good capillary refill, nail exam onychomycosis of the toenails, no trophic changes or ulcerative lesions. DP pulses palpable, PT pulses palpable, and protective sensation intact  No images are attached to the encounter.  Assessment:   1. Pain due to onychomycosis of nail    Plan:  Patient was evaluated and treated and all questions answered.  Onychomycosis -Nails palliatively debrided secondary to pain  Procedure: Nail Debridement Type of Debridement: manual, sharp debridement. Instrumentation: Nail nipper, rotary burr. Number of Nails: 10  No follow-ups on file.

## 2021-01-11 NOTE — Patient Instructions (Signed)
DUE TO COVID-19 ONLY ONE VISITOR IS ALLOWED TO COME WITH YOU AND STAY IN THE WAITING ROOM ONLY DURING PRE OP AND PROCEDURE.   **NO VISITORS ARE ALLOWED IN THE SHORT STAY AREA OR RECOVERY ROOM!!**  IF YOU WILL BE ADMITTED INTO THE HOSPITAL YOU ARE ALLOWED ONLY TWO SUPPORT PEOPLE DURING VISITATION HOURS ONLY (7 AM -8PM)   The support person(s) must pass our screening, gel in and out, and wear a mask at all times, including in the patient's room. Patients must also wear a mask when staff or their support person are in the room. Visitors GUEST BADGE MUST BE WORN VISIBLY  One adult visitor may remain with you overnight and MUST be in the room by 8 P.M.  No visitors under the age of 57. Any visitor under the age of 63 must be accompanied by an adult.    COVID SWAB TESTING MUST BE COMPLETED ON: 01/18/21  **MUST PRESENT COMPLETED FORM AT TESTING SITE**     8A - 3P    [facility] [place] (backside of the building) You are not required to quarantine, however you are required to wear a well-fitted mask when you are out and around people not in your household.  Hand Hygiene often Do NOT share personal items Notify your provider if you are in close contact with someone who has COVID or you develop fever 100.4 or greater, new onset of sneezing, cough, sore throat, shortness of breath or body aches.  [place], [address], must go inside of the hospital, NOT A DRIVE THRU!  (Must self quarantine after testing. Follow instructions on handout.)       Your procedure is scheduled on: 01/20/21   Report to [facility] Main Entrance    Report to admitting at : 10:15 AM   Call this number if you have problems the morning of surgery [phone]   CLEAR LIQUID DIET starting the day before surgery.   May have liquids until : 9:30 AM   day of surgery  CLEAR LIQUID DIET  Foods Allowed                                                                      Foods Excluded  Water, Black Coffee and tea, regular and decaf                             liquids that you cannot  Plain Jell-O in any flavor  (No red)                                           see through such as: Fruit ices (not with fruit pulp)                                     milk, soups, orange juice              Iced Popsicles (No red)  All solid food                                   Apple juices Sports drinks like Gatorade (No red) Lightly seasoned clear broth or consume(fat free) Sugar  Sample Menu Breakfast                                Lunch                                     Supper Cranberry juice                    Beef broth                            Chicken broth Jell-O                                     Grape juice                           Apple juice Coffee or tea                        Jell-O                                      Popsicle                                                Coffee or tea                        Coffee or tea     Oral Hygiene is also important to reduce your risk of infection.                                    Remember - BRUSH YOUR TEETH THE MORNING OF SURGERY WITH YOUR REGULAR TOOTHPASTE   Do NOT smoke after Midnight   Take these medicines the morning of surgery with A SIP OF WATER: escitalopram,cetirizine,tamsulosin.  DO NOT TAKE ANY ORAL DIABETIC MEDICATIONS DAY OF YOUR SURGERY                              You may not have any metal on your body including hair pins, jewelry, and body piercing             Do not wear lotions, powders, perfumes/cologne, or deodorant              Men may shave face and neck.   Do not bring valuables to the hospital. [place].   Contacts,  dentures or bridgework may not be worn into surgery.   Bring small overnight bag day of surgery.    Patients discharged on the day of surgery  will not be allowed to drive home.   Special Instructions: Bring a copy of your healthcare power of attorney and living will documents         the day of surgery if you haven't scanned them before.              Please read over the following fact sheets you were given: IF YOU HAVE QUESTIONS ABOUT YOUR PRE-OP INSTRUCTIONS PLEASE CALL [phone]     [facility] - Preparing for Surgery Before surgery, you can play an important role.  Because skin is not sterile, your skin needs to be as free of germs as possible.  You can reduce the number of germs on your skin by washing with CHG (chlorahexidine gluconate) soap before surgery.  CHG is an antiseptic cleaner which kills germs and bonds with the skin to continue killing germs even after washing. Please DO NOT use if you have an allergy to CHG or antibacterial soaps.  If your skin becomes reddened/irritated stop using the CHG and inform your nurse when you arrive at Short Stay. Do not shave (including legs and underarms) for at least 48 hours prior to the first CHG shower.  You may shave your face/neck. Please follow these instructions carefully:  1.  Shower with CHG Soap the night before surgery and the  morning of Surgery.  2.  If you choose to wash your hair, wash your hair first as usual with your  normal  shampoo.  3.  After you shampoo, rinse your hair and body thoroughly to remove the  shampoo.                           4.  Use CHG as you would any other liquid soap.  You can apply chg directly  to the skin and wash                       Gently with a scrungie or clean washcloth.  5.  Apply the CHG Soap to your body ONLY FROM THE NECK DOWN.   Do not use on face/ open                           Wound or open sores. Avoid contact with eyes, ears mouth and genitals (private parts).                       Wash face,  Genitals (private parts) with your normal soap.             6.  Wash thoroughly, paying special attention to the area where your surgery   will be performed.  7.  Thoroughly rinse your body with warm water from the neck down.  8.  DO NOT shower/wash with your normal soap after using and rinsing off  the CHG Soap.                9.  Pat yourself dry with a clean towel.            10.  Wear clean pajamas.            11.  Place clean sheets on your bed the night of your first  shower and do not  sleep with pets. Day of Surgery : Do not apply any lotions/deodorants the morning of surgery.  Please wear clean clothes to the hospital/surgery center.  FAILURE TO FOLLOW THESE INSTRUCTIONS MAY RESULT IN THE CANCELLATION OF YOUR SURGERY PATIENT SIGNATURE_________________________________  NURSE SIGNATURE__________________________________  ________________________________________________________________________

## 2021-01-12 ENCOUNTER — Encounter (HOSPITAL_COMMUNITY)
Admission: RE | Admit: 2021-01-12 | Discharge: 2021-01-12 | Disposition: A | Discharge status: Home / Self Care | Payer: Medicare HMO | Source: Ambulatory Visit | Attending: Urology | Admitting: Urology

## 2021-01-12 ENCOUNTER — Other Ambulatory Visit: Payer: Self-pay

## 2021-01-12 ENCOUNTER — Encounter (HOSPITAL_COMMUNITY): Payer: Self-pay

## 2021-01-12 VITALS — BP 156/98 | HR 99 | Temp 98.2°F | Ht 64.0 in | Wt 169.0 lb

## 2021-01-12 DIAGNOSIS — E119 Type 2 diabetes mellitus without complications: Secondary | ICD-10-CM | POA: Diagnosis not present

## 2021-01-12 DIAGNOSIS — Z01812 Encounter for preprocedural laboratory examination: Secondary | ICD-10-CM | POA: Diagnosis present

## 2021-01-12 DIAGNOSIS — I10 Essential (primary) hypertension: Secondary | ICD-10-CM | POA: Diagnosis not present

## 2021-01-12 HISTORY — DX: Chronic kidney disease, unspecified: N18.9

## 2021-01-12 HISTORY — DX: Angina pectoris, unspecified: I20.9

## 2021-01-12 LAB — BASIC METABOLIC PANEL
Anion gap: 6 (ref 5–15)
BUN: 16 mg/dL (ref 8–23)
CO2: 25 mmol/L (ref 22–32)
Calcium: 9.1 mg/dL (ref 8.9–10.3)
Chloride: 105 mmol/L (ref 98–111)
Creatinine, Ser: 0.72 mg/dL (ref 0.61–1.24)
GFR, Estimated: >60 mL/min (ref >60)
Glucose, Bld: 205 mg/dL — ABNORMAL HIGH (ref 70–99)
Potassium: 4 mmol/L (ref 3.5–5.1)
Sodium: 136 mmol/L (ref 135–145)

## 2021-01-12 LAB — CBC
HCT: 45.5 % (ref 39.0–52.0)
Hemoglobin: 15.1 g/dL (ref 13.0–17.0)
MCH: 30.4 pg (ref 26.0–34.0)
MCHC: 33.2 g/dL (ref 30.0–36.0)
MCV: 91.7 fL (ref 80.0–100.0)
Platelets: 244 10*3/uL (ref 150–400)
RBC: 4.96 MIL/uL (ref 4.22–5.81)
RDW: 13.4 % (ref 11.5–15.5)
WBC: 6.9 10*3/uL (ref 4.0–10.5)
nRBC: 0 % (ref 0.0–0.2)

## 2021-01-12 LAB — HEMOGLOBIN A1C
Hgb A1c MFr Bld: 8.9 % — ABNORMAL HIGH (ref 4.8–5.6)
Mean Plasma Glucose: 208.73 mg/dL

## 2021-01-12 LAB — GLUCOSE, CAPILLARY: Glucose-Capillary: 227 mg/dL — ABNORMAL HIGH (ref 70–99)

## 2021-01-12 NOTE — Progress Notes (Signed)
COVID Vaccine Completed:NO Date COVID Vaccine completed: COVID vaccine manufacturer: [employer] Test: 01/18/21  PCP - Kathrynn Ducking: FNP Cardiologist - DO: Berniece Salines. LOV: 10/13/20  Chest x-ray -  EKG - 06/20/20 Stress Test -  ECHO -  Cardiac Cath -  Pacemaker/ICD device last checked:  Sleep Study -  CPAP -   Fasting Blood Sugar -  Checks Blood Sugar _____ times a day  Blood Thinner Instructions: Aspirin Instructions: To hold 5 days before as per Dr. Tresa Moore instructions. Last Dose:  Anesthesia review: Hx: HTN,DIA,Chest pain  Patient denies shortness of breath, fever, cough and chest pain at PAT appointment   Patient verbalized understanding of instructions that were given to them at the PAT appointment. Patient was also instructed that they will need to review over the PAT instructions again at home before surgery.

## 2021-01-13 NOTE — Progress Notes (Signed)
Lab. Results A1C: 8.9

## 2021-01-14 NOTE — Progress Notes (Signed)
Anesthesia Chart Review   Case: [id number] Date/Time: 01/20/21 1215   Procedure: XI ROBOTIC ASSITED PARTIAL NEPHRECTOMY AND RENAL CYST DECORTICATION (Right) - 3 HRS   Anesthesia type: General   Pre-op diagnosis: RIGHT RENAL MASS AND CYSTS   Location[facility] ROOM 03 / [facility]   Surgeons: [name], MD       DISCUSSION:64 y.o. never smoker with h/o PONV, HTN, DM II, CKD, right renal mass and cysts scheduled for above procedure 01/20/2021 with Dr. [name].   A1C 8.9, forwarded to Dr. Tresa Moore.  This is elevated from 7.8 06/25/2020.   CT Coronary FFR 07/21/2020 with no obstructive coronary disease.  VS: BP (!) 156/98   Pulse 99   Temp 36.8 C (Oral)   Ht 5\' 4"  (1.626 m)   Wt 76.7 kg   SpO2 97%   BMI 29.01 kg/m   PROVIDERS: Gweneth Fritter, FNP is PCP   Berniece Salines, DO, is Cardiologist  LABS:  forwarded to Dr. Tresa Moore (all labs ordered are listed, but only abnormal results are displayed)  Labs Reviewed  HEMOGLOBIN A1C - Abnormal; Notable for the following components:      Result Value   Hgb A1c MFr Bld 8.9 (*)    All other components within normal limits  BASIC METABOLIC PANEL - Abnormal; Notable for the following components:   Glucose, Bld 205 (*)    All other components within normal limits  GLUCOSE, CAPILLARY - Abnormal; Notable for the following components:   Glucose-Capillary 227 (*)    All other components within normal limits  CBC     IMAGES: CT Coronary FFR 07/21/2020 IMPRESSION: 1.  CT FFR analysis showed no significant stenosis.   RECOMMENDATIONS: Guideline-directed medical therapy and aggressive risk factor modification for secondary prevention of coronary artery disease.  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 Overview:  history of meningitis as a child Formatting of this note might be different from the original. history of meningitis as a child   Essential hypertension 03/29/2016   Formatting of this note might be different from the original. Overview:  monitoring at present per PCP, not on meds at present Formatting of this note might be different from the original. 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   Knee pain 12/21/2011   Malaise and fatigue 11/27/2017   Meningitis    As a child which caused deafness   Mild episode of recurrent major depressive disorder ([place]) 11/27/2017   Mute 04/04/2017   PONV (postoperative nausea and vomiting)    Prediabetes 03/29/2016   Primary osteoarthritis of right hip 07/08/2014   Seasonal allergic rhinitis due to pollen 06/24/2019    Past Surgical  History:  Procedure Laterality Date   APPENDECTOMY     CHOLECYSTECTOMY     COLONOSCOPY     DENTAL SURGERY     ELBOW SURGERY Left    IRRIGATION AND DEBRIDEMENT KNEE Left 12/11/2010   Surgeon: Chong Sicilian, MD; [facility] Main OR   KNEE ARTHROPLASTY Left    TOTAL HIP ARTHROPLASTY Right 07/08/2014   Procedure: RIGHT  TOTAL HIP ARTHROPLASTY ANTERIOR APPROACH;  Surgeon: Melrose Nakayama, MD;  Location: [place];  Service: Orthopedics;  Laterality: Right;    MEDICATIONS:  aspirin EC 81 MG tablet   cetirizine (ZYRTEC) 10 MG tablet   escitalopram (LEXAPRO) 5 MG tablet    losartan (COZAAR) 25 MG tablet   metFORMIN (GLUCOPHAGE-XR) 500 MG 24 hr tablet   nitroGLYCERIN (NITROSTAT) 0.4 MG SL tablet   rosuvastatin (CRESTOR) 40 MG tablet   tamsulosin (FLOMAX) 0.4 MG CAPS capsule   No current facility-administered medications for this encounter.   Konrad Felix Ward, PA-C [facility] Pre-Surgical Testing [phone]

## 2021-01-18 ENCOUNTER — Other Ambulatory Visit: Payer: Self-pay | Admitting: Urology

## 2021-01-18 LAB — SARS CORONAVIRUS 2 (TAT 6-24 HRS): SARS Coronavirus 2: NEGATIVE

## 2021-01-20 ENCOUNTER — Inpatient Hospital Stay (HOSPITAL_COMMUNITY): Payer: Medicare HMO | Admitting: Physician Assistant

## 2021-01-20 ENCOUNTER — Inpatient Hospital Stay (HOSPITAL_COMMUNITY)
Admission: RE | Admit: 2021-01-20 | Discharge: 2021-01-22 | DRG: 658 | Disposition: A | Discharge status: Home / Self Care | Payer: Medicare HMO | Attending: Urology | Admitting: Urology

## 2021-01-20 ENCOUNTER — Inpatient Hospital Stay (HOSPITAL_COMMUNITY): Payer: Medicare HMO | Admitting: Certified Registered Nurse Anesthetist

## 2021-01-20 ENCOUNTER — Encounter (HOSPITAL_COMMUNITY): Payer: Self-pay | Admitting: Urology

## 2021-01-20 ENCOUNTER — Encounter (HOSPITAL_COMMUNITY)
Admission: RE | Disposition: A | Discharge status: Home / Self Care | Payer: Self-pay | Source: Home / Self Care | Attending: Urology

## 2021-01-20 DIAGNOSIS — I10 Essential (primary) hypertension: Secondary | ICD-10-CM | POA: Diagnosis present

## 2021-01-20 DIAGNOSIS — Z9049 Acquired absence of other specified parts of digestive tract: Secondary | ICD-10-CM

## 2021-01-20 DIAGNOSIS — I2510 Atherosclerotic heart disease of native coronary artery without angina pectoris: Secondary | ICD-10-CM | POA: Diagnosis present

## 2021-01-20 DIAGNOSIS — E119 Type 2 diabetes mellitus without complications: Secondary | ICD-10-CM | POA: Diagnosis present

## 2021-01-20 DIAGNOSIS — Z881 Allergy status to other antibiotic agents status: Secondary | ICD-10-CM

## 2021-01-20 DIAGNOSIS — C641 Malignant neoplasm of right kidney, except renal pelvis: Secondary | ICD-10-CM | POA: Diagnosis present

## 2021-01-20 DIAGNOSIS — Z8661 Personal history of infections of the central nervous system: Secondary | ICD-10-CM | POA: Diagnosis not present

## 2021-01-20 DIAGNOSIS — Z794 Long term (current) use of insulin: Secondary | ICD-10-CM | POA: Diagnosis not present

## 2021-01-20 DIAGNOSIS — H905 Unspecified sensorineural hearing loss: Secondary | ICD-10-CM | POA: Diagnosis present

## 2021-01-20 DIAGNOSIS — R42 Dizziness and giddiness: Secondary | ICD-10-CM | POA: Diagnosis not present

## 2021-01-20 DIAGNOSIS — Z79899 Other long term (current) drug therapy: Secondary | ICD-10-CM | POA: Diagnosis not present

## 2021-01-20 DIAGNOSIS — K660 Peritoneal adhesions (postprocedural) (postinfection): Secondary | ICD-10-CM | POA: Diagnosis present

## 2021-01-20 DIAGNOSIS — Z96652 Presence of left artificial knee joint: Secondary | ICD-10-CM | POA: Diagnosis present

## 2021-01-20 DIAGNOSIS — M1990 Unspecified osteoarthritis, unspecified site: Secondary | ICD-10-CM | POA: Diagnosis present

## 2021-01-20 DIAGNOSIS — Z87442 Personal history of urinary calculi: Secondary | ICD-10-CM

## 2021-01-20 DIAGNOSIS — Z8249 Family history of ischemic heart disease and other diseases of the circulatory system: Secondary | ICD-10-CM | POA: Diagnosis not present

## 2021-01-20 DIAGNOSIS — N281 Cyst of kidney, acquired: Secondary | ICD-10-CM | POA: Diagnosis present

## 2021-01-20 DIAGNOSIS — F419 Anxiety disorder, unspecified: Secondary | ICD-10-CM | POA: Diagnosis present

## 2021-01-20 DIAGNOSIS — Z96641 Presence of right artificial hip joint: Secondary | ICD-10-CM | POA: Diagnosis present

## 2021-01-20 DIAGNOSIS — E785 Hyperlipidemia, unspecified: Secondary | ICD-10-CM | POA: Diagnosis present

## 2021-01-20 DIAGNOSIS — Z7984 Long term (current) use of oral hypoglycemic drugs: Secondary | ICD-10-CM

## 2021-01-20 DIAGNOSIS — Z20822 Contact with and (suspected) exposure to covid-19: Secondary | ICD-10-CM | POA: Diagnosis present

## 2021-01-20 DIAGNOSIS — N2889 Other specified disorders of kidney and ureter: Secondary | ICD-10-CM | POA: Diagnosis present

## 2021-01-20 HISTORY — PX: ROBOTIC ASSITED PARTIAL NEPHRECTOMY: SHX6087

## 2021-01-20 LAB — TYPE AND SCREEN
ABO/RH(D): A POS
Antibody Screen: NEGATIVE

## 2021-01-20 LAB — GLUCOSE, CAPILLARY
Glucose-Capillary: 190 mg/dL — ABNORMAL HIGH (ref 70–99)
Glucose-Capillary: 253 mg/dL — ABNORMAL HIGH (ref 70–99)
Glucose-Capillary: 284 mg/dL — ABNORMAL HIGH (ref 70–99)

## 2021-01-20 LAB — HEMOGLOBIN AND HEMATOCRIT, BLOOD
HCT: 41.1 % (ref 39.0–52.0)
Hemoglobin: 13.7 g/dL (ref 13.0–17.0)

## 2021-01-20 LAB — ABO/RH: ABO/RH(D): A POS

## 2021-01-20 SURGERY — NEPHRECTOMY, PARTIAL, ROBOT-ASSISTED
Anesthesia: General | Laterality: Right

## 2021-01-20 MED ORDER — DEXAMETHASONE SODIUM PHOSPHATE 10 MG/ML IJ SOLN
INTRAMUSCULAR | Status: AC
  Filled 2021-01-20: qty 1

## 2021-01-20 MED ORDER — SODIUM CHLORIDE 0.45 % IV SOLN: INTRAVENOUS | Status: DC

## 2021-01-20 MED ORDER — ALBUMIN HUMAN 5 % IV SOLN
INTRAVENOUS | Status: AC
  Filled 2021-01-20: qty 250

## 2021-01-20 MED ORDER — PROPOFOL 10 MG/ML IV BOLUS
INTRAVENOUS | Status: AC
  Filled 2021-01-20: qty 20

## 2021-01-20 MED ORDER — SODIUM CHLORIDE 0.9% FLUSH
INTRAVENOUS | Status: DC | PRN
  Administered 2021-01-20: 20 mL

## 2021-01-20 MED ORDER — FENTANYL CITRATE (PF) 100 MCG/2ML IJ SOLN
INTRAMUSCULAR | Status: DC | PRN
  Administered 2021-01-20: 100 ug via INTRAVENOUS
  Administered 2021-01-20 (×5): 50 ug via INTRAVENOUS

## 2021-01-20 MED ORDER — CHLORHEXIDINE GLUCONATE CLOTH 2 % EX PADS
6.0000 | MEDICATED_PAD | Freq: Every day | CUTANEOUS | Status: DC
  Administered 2021-01-21: 6 via TOPICAL

## 2021-01-20 MED ORDER — FENTANYL CITRATE PF 50 MCG/ML IJ SOSY
25.0000 ug | PREFILLED_SYRINGE | INTRAMUSCULAR | Status: DC | PRN
  Administered 2021-01-20: 25 ug via INTRAVENOUS

## 2021-01-20 MED ORDER — ROCURONIUM BROMIDE 10 MG/ML (PF) SYRINGE
PREFILLED_SYRINGE | INTRAVENOUS | Status: AC
  Filled 2021-01-20: qty 10

## 2021-01-20 MED ORDER — AMISULPRIDE (ANTIEMETIC) 5 MG/2ML IV SOLN
INTRAVENOUS | Status: AC
  Administered 2021-01-20: 10 mg via INTRAVENOUS
  Filled 2021-01-20: qty 4

## 2021-01-20 MED ORDER — SUGAMMADEX SODIUM 200 MG/2ML IV SOLN
INTRAVENOUS | Status: DC | PRN
  Administered 2021-01-20: 200 mg via INTRAVENOUS

## 2021-01-20 MED ORDER — ROSUVASTATIN CALCIUM 20 MG PO TABS
40.0000 mg | ORAL_TABLET | Freq: Every day | ORAL | Status: DC
  Administered 2021-01-20 – 2021-01-22 (×3): 40 mg via ORAL
  Filled 2021-01-20 (×3): qty 2

## 2021-01-20 MED ORDER — INSULIN ASPART 100 UNIT/ML IJ SOLN
3.0000 [IU] | Freq: Once | INTRAMUSCULAR | Status: AC
  Administered 2021-01-20: 3 [IU] via SUBCUTANEOUS

## 2021-01-20 MED ORDER — DIPHENHYDRAMINE HCL 12.5 MG/5ML PO ELIX: 12.5000 mg | ORAL_SOLUTION | Freq: Four times a day (QID) | ORAL | Status: DC | PRN

## 2021-01-20 MED ORDER — FENTANYL CITRATE PF 50 MCG/ML IJ SOSY
PREFILLED_SYRINGE | INTRAMUSCULAR | Status: AC
  Administered 2021-01-20: 25 ug via INTRAVENOUS
  Filled 2021-01-20: qty 2

## 2021-01-20 MED ORDER — BACITRACIN-NEOMYCIN-POLYMYXIN 400-5-5000 EX OINT: 1.0000 "application " | TOPICAL_OINTMENT | Freq: Three times a day (TID) | CUTANEOUS | Status: DC | PRN

## 2021-01-20 MED ORDER — DOCUSATE SODIUM 100 MG PO CAPS
100.0000 mg | ORAL_CAPSULE | Freq: Two times a day (BID) | ORAL | Status: DC
  Administered 2021-01-20 – 2021-01-22 (×4): 100 mg via ORAL
  Filled 2021-01-20 (×4): qty 1

## 2021-01-20 MED ORDER — INSULIN ASPART 100 UNIT/ML IJ SOLN
0.0000 [IU] | Freq: Three times a day (TID) | INTRAMUSCULAR | Status: DC
  Administered 2021-01-21: 8 [IU] via SUBCUTANEOUS
  Administered 2021-01-21 – 2021-01-22 (×3): 3 [IU] via SUBCUTANEOUS

## 2021-01-20 MED ORDER — BELLADONNA ALKALOIDS-OPIUM 16.2-30 MG RE SUPP: 1.0000 | Freq: Four times a day (QID) | RECTAL | Status: DC | PRN

## 2021-01-20 MED ORDER — ONDANSETRON HCL 4 MG/2ML IJ SOLN: 4.0000 mg | INTRAMUSCULAR | Status: DC | PRN

## 2021-01-20 MED ORDER — EPHEDRINE SULFATE-NACL 50-0.9 MG/10ML-% IV SOSY
PREFILLED_SYRINGE | INTRAVENOUS | Status: DC | PRN
  Administered 2021-01-20 (×5): 5 mg via INTRAVENOUS

## 2021-01-20 MED ORDER — NITROGLYCERIN 0.4 MG SL SUBL: 0.4000 mg | SUBLINGUAL_TABLET | SUBLINGUAL | Status: DC | PRN

## 2021-01-20 MED ORDER — KETOROLAC TROMETHAMINE 15 MG/ML IJ SOLN: 15.0000 mg | Freq: Once | INTRAMUSCULAR | Status: AC | PRN

## 2021-01-20 MED ORDER — AMISULPRIDE (ANTIEMETIC) 5 MG/2ML IV SOLN: 10.0000 mg | Freq: Once | INTRAVENOUS | Status: AC | PRN

## 2021-01-20 MED ORDER — PHENYLEPHRINE 40 MCG/ML (10ML) SYRINGE FOR IV PUSH (FOR BLOOD PRESSURE SUPPORT)
PREFILLED_SYRINGE | INTRAVENOUS | Status: DC | PRN
  Administered 2021-01-20: 120 ug via INTRAVENOUS

## 2021-01-20 MED ORDER — CEFAZOLIN SODIUM-DEXTROSE 2-4 GM/100ML-% IV SOLN
2.0000 g | INTRAVENOUS | Status: AC
  Administered 2021-01-20: 2 g via INTRAVENOUS
  Filled 2021-01-20: qty 100

## 2021-01-20 MED ORDER — HYDROCODONE-ACETAMINOPHEN 5-325 MG PO TABS: 1.0000 | ORAL_TABLET | Freq: Four times a day (QID) | ORAL | 0 refills | Status: AC | PRN

## 2021-01-20 MED ORDER — LACTATED RINGERS IR SOLN
Status: DC | PRN
  Administered 2021-01-20: 1000 mL

## 2021-01-20 MED ORDER — ROCURONIUM BROMIDE 10 MG/ML (PF) SYRINGE
PREFILLED_SYRINGE | INTRAVENOUS | Status: DC | PRN
  Administered 2021-01-20: 60 mg via INTRAVENOUS
  Administered 2021-01-20: 10 mg via INTRAVENOUS
  Administered 2021-01-20: 20 mg via INTRAVENOUS

## 2021-01-20 MED ORDER — ALBUMIN HUMAN 5 % IV SOLN: INTRAVENOUS | Status: DC | PRN

## 2021-01-20 MED ORDER — INSULIN ASPART 100 UNIT/ML IJ SOLN
INTRAMUSCULAR | Status: AC
  Filled 2021-01-20: qty 1

## 2021-01-20 MED ORDER — HYDROMORPHONE HCL 1 MG/ML IJ SOLN
0.5000 mg | INTRAMUSCULAR | Status: DC | PRN
Start: 2021-01-20 — End: 2021-01-22
  Administered 2021-01-21 (×2): 1 mg via INTRAVENOUS
  Filled 2021-01-20 (×2): qty 1

## 2021-01-20 MED ORDER — PROPOFOL 10 MG/ML IV BOLUS
INTRAVENOUS | Status: DC | PRN
  Administered 2021-01-20: 150 mg via INTRAVENOUS

## 2021-01-20 MED ORDER — PHENYLEPHRINE HCL-NACL 20-0.9 MG/250ML-% IV SOLN
INTRAVENOUS | Status: DC | PRN
  Administered 2021-01-20: 50 ug/min via INTRAVENOUS

## 2021-01-20 MED ORDER — ORAL CARE MOUTH RINSE: 15.0000 mL | Freq: Once | OROMUCOSAL | Status: AC

## 2021-01-20 MED ORDER — LIDOCAINE HCL (PF) 2 % IJ SOLN
INTRAMUSCULAR | Status: AC
  Filled 2021-01-20: qty 5

## 2021-01-20 MED ORDER — FENTANYL CITRATE (PF) 250 MCG/5ML IJ SOLN
INTRAMUSCULAR | Status: AC
  Filled 2021-01-20: qty 5

## 2021-01-20 MED ORDER — ACETAMINOPHEN 10 MG/ML IV SOLN
INTRAVENOUS | Status: AC
  Administered 2021-01-20: 1000 mg via INTRAVENOUS
  Filled 2021-01-20: qty 100

## 2021-01-20 MED ORDER — LACTATED RINGERS IV SOLN: INTRAVENOUS | Status: DC

## 2021-01-20 MED ORDER — KETOROLAC TROMETHAMINE 15 MG/ML IJ SOLN
INTRAMUSCULAR | Status: AC
  Administered 2021-01-20: 15 mg via INTRAVENOUS
  Filled 2021-01-20: qty 1

## 2021-01-20 MED ORDER — ACETAMINOPHEN 10 MG/ML IV SOLN: 1000.0000 mg | Freq: Once | INTRAVENOUS | Status: DC | PRN

## 2021-01-20 MED ORDER — DEXAMETHASONE SODIUM PHOSPHATE 10 MG/ML IJ SOLN
INTRAMUSCULAR | Status: DC | PRN
  Administered 2021-01-20: 10 mg via INTRAVENOUS

## 2021-01-20 MED ORDER — MIDAZOLAM HCL 5 MG/5ML IJ SOLN
INTRAMUSCULAR | Status: DC | PRN
  Administered 2021-01-20: 2 mg via INTRAVENOUS

## 2021-01-20 MED ORDER — ONDANSETRON HCL 4 MG/2ML IJ SOLN: 4.0000 mg | Freq: Once | INTRAMUSCULAR | Status: DC | PRN

## 2021-01-20 MED ORDER — DIPHENHYDRAMINE HCL 50 MG/ML IJ SOLN: 12.5000 mg | Freq: Four times a day (QID) | INTRAMUSCULAR | Status: DC | PRN

## 2021-01-20 MED ORDER — DOCUSATE SODIUM 100 MG PO CAPS: 100.0000 mg | ORAL_CAPSULE | Freq: Two times a day (BID) | ORAL | Status: AC

## 2021-01-20 MED ORDER — TAMSULOSIN HCL 0.4 MG PO CAPS
0.4000 mg | ORAL_CAPSULE | Freq: Every day | ORAL | Status: DC
  Administered 2021-01-21 – 2021-01-22 (×2): 0.4 mg via ORAL
  Filled 2021-01-20 (×2): qty 1

## 2021-01-20 MED ORDER — LOSARTAN POTASSIUM 25 MG PO TABS
25.0000 mg | ORAL_TABLET | Freq: Every day | ORAL | Status: DC
  Administered 2021-01-20 – 2021-01-22 (×3): 25 mg via ORAL
  Filled 2021-01-20 (×3): qty 1

## 2021-01-20 MED ORDER — FENTANYL CITRATE (PF) 100 MCG/2ML IJ SOLN
INTRAMUSCULAR | Status: AC
  Filled 2021-01-20: qty 2

## 2021-01-20 MED ORDER — CHLORHEXIDINE GLUCONATE 0.12 % MT SOLN
15.0000 mL | Freq: Once | OROMUCOSAL | Status: AC
  Administered 2021-01-20: 15 mL via OROMUCOSAL

## 2021-01-20 MED ORDER — EPHEDRINE 5 MG/ML INJ
INTRAVENOUS | Status: AC
  Filled 2021-01-20: qty 5

## 2021-01-20 MED ORDER — ONDANSETRON HCL 4 MG/2ML IJ SOLN
INTRAMUSCULAR | Status: DC | PRN
  Administered 2021-01-20: 4 mg via INTRAVENOUS

## 2021-01-20 MED ORDER — MIDAZOLAM HCL 2 MG/2ML IJ SOLN
INTRAMUSCULAR | Status: AC
  Filled 2021-01-20: qty 2

## 2021-01-20 MED ORDER — OXYCODONE HCL 5 MG PO TABS
5.0000 mg | ORAL_TABLET | ORAL | Status: DC | PRN
  Administered 2021-01-21 (×2): 5 mg via ORAL
  Filled 2021-01-20 (×2): qty 1

## 2021-01-20 MED ORDER — ONDANSETRON HCL 4 MG/2ML IJ SOLN
INTRAMUSCULAR | Status: AC
  Filled 2021-01-20: qty 2

## 2021-01-20 MED ORDER — BUPIVACAINE LIPOSOME 1.3 % IJ SUSP
INTRAMUSCULAR | Status: DC | PRN
  Administered 2021-01-20: 40 mL

## 2021-01-20 MED ORDER — STERILE WATER FOR IRRIGATION IR SOLN
Status: DC | PRN
  Administered 2021-01-20: 1000 mL

## 2021-01-20 MED ORDER — ACETAMINOPHEN 500 MG PO TABS
1000.0000 mg | ORAL_TABLET | Freq: Four times a day (QID) | ORAL | Status: AC
  Administered 2021-01-20 – 2021-01-21 (×4): 1000 mg via ORAL
  Filled 2021-01-20 (×4): qty 2

## 2021-01-20 MED ORDER — BUPIVACAINE LIPOSOME 1.3 % IJ SUSP
INTRAMUSCULAR | Status: AC
  Filled 2021-01-20: qty 20

## 2021-01-20 MED ORDER — ESCITALOPRAM OXALATE 10 MG PO TABS
5.0000 mg | ORAL_TABLET | Freq: Every day | ORAL | Status: DC
  Administered 2021-01-21 – 2021-01-22 (×2): 5 mg via ORAL
  Filled 2021-01-20 (×2): qty 1

## 2021-01-20 MED ORDER — LIDOCAINE 2% (20 MG/ML) 5 ML SYRINGE
INTRAMUSCULAR | Status: DC | PRN
  Administered 2021-01-20: 60 mg via INTRAVENOUS

## 2021-01-20 MED ORDER — SODIUM CHLORIDE (PF) 0.9 % IJ SOLN
INTRAMUSCULAR | Status: AC
  Filled 2021-01-20: qty 20

## 2021-01-20 SURGICAL SUPPLY — 68 items
APPLICATOR SURGIFLO ENDO (HEMOSTASIS) ×2 IMPLANT
BAG COUNTER SPONGE SURGICOUNT (BAG) IMPLANT
CHLORAPREP W/TINT 26 (MISCELLANEOUS) ×2 IMPLANT
CLIP LIGATING HEM O LOK PURPLE (MISCELLANEOUS) ×2 IMPLANT
CLIP LIGATING HEMO LOK XL GOLD (MISCELLANEOUS) ×2 IMPLANT
CLIP LIGATING HEMO O LOK GREEN (MISCELLANEOUS) ×6 IMPLANT
CLIP SUT LAPRA TY ABSORB (SUTURE) ×2 IMPLANT
COVER SURGICAL LIGHT HANDLE (MISCELLANEOUS) ×2 IMPLANT
COVER TIP SHEARS 8 DVNC (MISCELLANEOUS) ×1 IMPLANT
COVER TIP SHEARS 8MM DA VINCI (MISCELLANEOUS) ×1
CUTTER ECHEON FLEX ENDO 45 340 (ENDOMECHANICALS) ×2 IMPLANT
DECANTER SPIKE VIAL GLASS SM (MISCELLANEOUS) IMPLANT
DERMABOND ADVANCED (GAUZE/BANDAGES/DRESSINGS) ×1
DERMABOND ADVANCED .7 DNX12 (GAUZE/BANDAGES/DRESSINGS) ×1 IMPLANT
DRAIN CHANNEL 15F RND FF 3/16 (WOUND CARE) ×2 IMPLANT
DRAPE ARM DVNC X/XI (DISPOSABLE) ×4 IMPLANT
DRAPE COLUMN DVNC XI (DISPOSABLE) ×1 IMPLANT
DRAPE DA VINCI XI ARM (DISPOSABLE) ×4
DRAPE DA VINCI XI COLUMN (DISPOSABLE) ×1
DRAPE INCISE IOBAN 66X45 STRL (DRAPES) ×2 IMPLANT
DRAPE SHEET LG 3/4 BI-LAMINATE (DRAPES) ×2 IMPLANT
DRSG TEGADERM 4X4.75 (GAUZE/BANDAGES/DRESSINGS) ×2 IMPLANT
ELECT PENCIL ROCKER SW 15FT (MISCELLANEOUS) ×2 IMPLANT
ELECT REM PT RETURN 15FT ADLT (MISCELLANEOUS) ×2 IMPLANT
EVACUATOR SILICONE 100CC (DRAIN) ×2 IMPLANT
GAUZE 4X4 16PLY ~~LOC~~+RFID DBL (SPONGE) ×2 IMPLANT
GLOVE SURG ENC MOIS LTX SZ6.5 (GLOVE) ×2 IMPLANT
GLOVE SURG ENC TEXT LTX SZ7.5 (GLOVE) ×4 IMPLANT
GOWN STRL REUS W/TWL LRG LVL3 (GOWN DISPOSABLE) ×8 IMPLANT
HEMOSTAT SURGICEL 4X8 (HEMOSTASIS) ×2 IMPLANT
HOLDER FOLEY CATH W/STRAP (MISCELLANEOUS) ×2 IMPLANT
IRRIG SUCT STRYKERFLOW 2 WTIP (MISCELLANEOUS) ×2
IRRIGATION SUCT STRKRFLW 2 WTP (MISCELLANEOUS) ×1 IMPLANT
KIT BASIN OR (CUSTOM PROCEDURE TRAY) ×2 IMPLANT
KIT TURNOVER KIT A (KITS) ×2 IMPLANT
LOOP VESSEL MAXI BLUE (MISCELLANEOUS) ×2 IMPLANT
MARKER SKIN DUAL TIP RULER LAB (MISCELLANEOUS) ×2 IMPLANT
NEEDLE INSUFFLATION 14GA 120MM (NEEDLE) ×2 IMPLANT
PORT ACCESS TROCAR AIRSEAL 12 (TROCAR) ×1 IMPLANT
PORT ACCESS TROCAR AIRSEAL 5M (TROCAR) ×1
POUCH SPECIMEN RETRIEVAL 10MM (ENDOMECHANICALS) ×2 IMPLANT
PROTECTOR NERVE ULNAR (MISCELLANEOUS) ×4 IMPLANT
SEAL CANN UNIV 5-8 DVNC XI (MISCELLANEOUS) ×4 IMPLANT
SEAL XI 5MM-8MM UNIVERSAL (MISCELLANEOUS) ×4
SET TRI-LUMEN FLTR TB AIRSEAL (TUBING) ×2 IMPLANT
SOLUTION ELECTROLUBE (MISCELLANEOUS) ×2 IMPLANT
SPONGE T-LAP 4X18 ~~LOC~~+RFID (SPONGE) ×2 IMPLANT
STAPLE RELOAD 45 WHT (STAPLE) IMPLANT
STAPLE RELOAD 45MM WHITE (STAPLE)
SURGIFLO W/THROMBIN 8M KIT (HEMOSTASIS) ×2 IMPLANT
SUT ETHILON 3 0 PS 1 (SUTURE) ×2 IMPLANT
SUT MNCRL AB 4-0 PS2 18 (SUTURE) ×4 IMPLANT
SUT PDS AB 1 CT1 27 (SUTURE) ×4 IMPLANT
SUT V-LOC BARB 180 2/0GR6 GS22 (SUTURE) ×6
SUT VIC AB 0 CT1 27 (SUTURE) ×4
SUT VIC AB 0 CT1 27XBRD ANTBC (SUTURE) ×4 IMPLANT
SUT VIC AB 2-0 SH 27 (SUTURE) ×1
SUT VIC AB 2-0 SH 27X BRD (SUTURE) ×1 IMPLANT
SUT VLOC BARB 180 ABS3/0GR12 (SUTURE) ×6
SUTURE V-LC BRB 180 2/0GR6GS22 (SUTURE) ×3 IMPLANT
SUTURE VLOC BRB 180 ABS3/0GR12 (SUTURE) ×3 IMPLANT
TOWEL OR 17X26 10 PK STRL BLUE (TOWEL DISPOSABLE) ×2 IMPLANT
TOWEL OR NON WOVEN STRL DISP B (DISPOSABLE) ×2 IMPLANT
TRAY FOLEY MTR SLVR 16FR STAT (SET/KITS/TRAYS/PACK) ×2 IMPLANT
TRAY LAPAROSCOPIC (CUSTOM PROCEDURE TRAY) ×2 IMPLANT
TROCAR BLADELESS OPT 5 100 (ENDOMECHANICALS) ×2 IMPLANT
TROCAR XCEL 12X100 BLDLESS (ENDOMECHANICALS) ×2 IMPLANT
WATER STERILE IRR 1000ML POUR (IV SOLUTION) ×2 IMPLANT

## 2021-01-20 NOTE — Discharge Instructions (Signed)

## 2021-01-20 NOTE — Anesthesia Postprocedure Evaluation (Signed)
Anesthesia Post Note  Patient: Zachary Lopez  Procedure(s) Performed: XI ROBOTIC ASSITED PARTIAL NEPHRECTOMY AND RENAL CYST DECORTICATION (Right)     Patient location during evaluation: PACU Anesthesia Type: General Level of consciousness: awake Pain management: pain level controlled Vital Signs Assessment: post-procedure vital signs reviewed and stable Respiratory status: spontaneous breathing, nonlabored ventilation, respiratory function stable and patient connected to nasal cannula oxygen Cardiovascular status: blood pressure returned to baseline and stable Postop Assessment: no apparent nausea or vomiting Anesthetic complications: no   No notable events documented.  Last Vitals:  Vitals:   01/20/21 1820 01/20/21 1853  BP:  131/79  Pulse:  (!) 105  Resp:  18  Temp:  36.9 C  SpO2: 94% 93%    Last Pain:  Vitals:   01/20/21 1953  TempSrc:   PainSc: 3                  [name] [name]

## 2021-01-20 NOTE — Anesthesia Preprocedure Evaluation (Addendum)
Anesthesia Evaluation  Patient identified by MRN, date of birth, ID band Patient awake    Reviewed: Allergy & Precautions, NPO status , Patient's Chart, lab work & pertinent test results  Airway Mallampati: III  TM Distance: >3 FB Neck ROM: Full    Dental no notable dental hx.    Pulmonary neg pulmonary ROS,    Pulmonary exam normal breath sounds clear to auscultation       Cardiovascular hypertension, Pt. on medications + CAD  Normal cardiovascular exam Rhythm:Regular Rate:Normal  ECG: ST, rate 101   Neuro/Psych PSYCHIATRIC DISORDERS Anxiety Depression Deaf, bilateral    GI/Hepatic negative GI ROS, Neg liver ROS,   Endo/Other  diabetes, Oral Hypoglycemic Agents  Renal/GU negative Renal ROS     Musculoskeletal  (+) Arthritis ,   Abdominal   Peds  Hematology HLD   Anesthesia Other Findings RIGHT RENAL MASS AND CYSTS  Reproductive/Obstetrics                            Anesthesia Physical Anesthesia Plan  ASA: 2  Anesthesia Plan: General   Post-op Pain Management:    Induction: Intravenous  PONV Risk Score and Plan: 2 and Ondansetron, Dexamethasone, Midazolam and Treatment may vary due to age or medical condition  Airway Management Planned: Oral ETT  Additional Equipment:   Intra-op Plan:   Post-operative Plan: Extubation in OR  Informed Consent: I have reviewed the patients History and Physical, chart, labs and discussed the procedure including the risks, benefits and alternatives for the proposed anesthesia with the patient or authorized representative who has indicated his/her understanding and acceptance.     Dental advisory given  Plan Discussed with: CRNA  Anesthesia Plan Comments: ([place])       Anesthesia Quick Evaluation

## 2021-01-20 NOTE — H&P (Signed)
Zachary Lopez is an 65 y.o. male.    Chief Complaint: Pre-OP RIGHT Partial Nephrectomy  HPI:   1 - Right Renal Mass - 2cm solid Rt upper lateral 50% exophytic enhancing renal mass incidetnal on CT 05/2020 at time of small ureteral stone. 2 artery, 2 vein right renovascular anatomy.   Recent Surveillance:  11/2020 - CT - stable 2cm, stone free.    2 -  Non-Complex Renal Cysts - LUP 5cm, LLP 2cm, RUP 3cm B1 cysts by CT and MRI 2022, unlike his above mass, these have no cancerous characteristics onimaging.   PMH sig for IDDM2 (A1c 7-8), total knee, lap appy, lap chole, congenital TOTAL DEAFNESS. HIs sister [name] lives near and is very involved and is POA and primary contact. 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 His PCP is [name] MD.   Today " Banyan " is seen to proceed with RIGHT robotic partial nephrectomy.Hgb 15.1, Cr 0.7.     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 Overview:  history of meningitis as a child Formatting of this note might be different from the original. history of meningitis as a child   Essential hypertension 03/29/2016   Formatting of this note might be different from the original. Overview:  monitoring at present per PCP, not on meds at present Formatting of this note might be different from the original. monitoring at present per PCP, not on meds at present   High risk medication use 03/29/2016   Hyperlipemia    Hyperlipidemia 01/28/2016   Infection of prosthetic left knee  joint ([place]) 12/11/2010   Knee pain 12/21/2011   Malaise and fatigue 11/27/2017   Meningitis    As a child which caused deafness   Mild episode of recurrent major depressive disorder ([name]) 11/27/2017   Mute 04/04/2017   PONV (postoperative nausea and vomiting)    Prediabetes 03/29/2016   Primary osteoarthritis of right hip 07/08/2014   Seasonal allergic rhinitis due to pollen 06/24/2019    Past Surgical History:  Procedure Laterality Date   APPENDECTOMY     CHOLECYSTECTOMY     COLONOSCOPY     DENTAL SURGERY     ELBOW SURGERY Left    IRRIGATION AND DEBRIDEMENT KNEE Left 12/11/2010   Surgeon: Chong Sicilian, MD; [facility] Main OR   KNEE ARTHROPLASTY Left    TOTAL HIP ARTHROPLASTY Right 07/08/2014   Procedure: RIGHT  TOTAL HIP ARTHROPLASTY ANTERIOR APPROACH;  Surgeon: Melrose Nakayama, MD;  Location: [place];  Service: Orthopedics;  Laterality: Right;    Family History  Problem Relation Age of Onset   Cancer Mother    Heart failure Father    Heart disease Father    Social History:  reports that he has never smoked. He has never used smokeless tobacco. He reports that he does not drink alcohol and does not use drugs.  Allergies:  Allergies  Allergen Reactions   Cefepime Rash and Hives   Vancomycin Anaphylaxis  No medications prior to admission.    No results found for this or any previous visit (from the past 48 hour(s)). No results found.  Review of Systems  Constitutional:  Negative for chills and fever.  All other systems reviewed and are negative.  There were no vitals taken for this visit. Physical Exam Vitals reviewed.  HENT:     Head: Normocephalic.     Nose: Nose normal.  Eyes:     Pupils: Pupils are equal, round, and reactive to light.  Pulmonary:     Effort: Pulmonary effort is normal.  Abdominal:     General: Abdomen is flat.  Genitourinary:    Comments: No CVAT Musculoskeletal:        General: Normal range of motion.     Cervical back: Normal range  of motion.  Skin:    General: Skin is warm.  Neurological:     General: No focal deficit present.     Mental Status: He is alert.  Psychiatric:        Mood and Affect: Mood normal.     Assessment/Plan  Proceed as planned with RIGHT robotic partial neprectomy + cyst decortication. Risks beneftis, alternative, expected peri-op course disucsed previously and reiterated today.   [name], MD 01/20/2021, 6:54 AM

## 2021-01-20 NOTE — Transfer of Care (Signed)
Immediate Anesthesia Transfer of Care Note  Patient: Zachary Lopez  Procedure(s) Performed: XI ROBOTIC ASSITED PARTIAL NEPHRECTOMY AND RENAL CYST DECORTICATION (Right)  Patient Location: PACU  Anesthesia Type:General  Level of Consciousness: awake and alert   Airway & Oxygen Therapy: Patient Spontanous Breathing and Patient connected to face mask oxygen  Post-op Assessment: Report given to RN, Post -op Vital signs reviewed and stable and Patient moving all extremities X 4  Post vital signs: Reviewed and stable  Last Vitals:  Vitals Value Taken Time  BP 167/90 01/20/21 1551  Temp    Pulse 119 01/20/21 1554  Resp 14   SpO2 99 % 01/20/21 1554  Vitals shown include unvalidated device data.  Last Pain:  Vitals:   01/20/21 1037  TempSrc: Oral  PainSc:       Patients Stated Pain Goal: 3 (42/87/68 1157)  Complications: No notable events documented.

## 2021-01-20 NOTE — Brief Op Note (Signed)
01/20/2021  3:33 PM  PATIENT:  Zachary Lopez  65 y.o. male  PRE-OPERATIVE DIAGNOSIS:  RIGHT RENAL MASS AND CYSTS  POST-OPERATIVE DIAGNOSIS:  RIGHT RENAL MASS AND CYSTS  PROCEDURE:  Procedure(s) with comments: XI ROBOTIC ASSITED PARTIAL NEPHRECTOMY AND RENAL CYST DECORTICATION (Right) - 3 HRS  SURGEON:  Surgeon(s) and Role:    [name], MD - Primary  PHYSICIAN ASSISTANT:   ASSISTANTS: Debbrah Alar PA   ANESTHESIA:   local and general  EBL:  500 mL   BLOOD ADMINISTERED:none  DRAINS:  JP to bulb; Foley to gravity    LOCAL MEDICATIONS USED:  MARCAINE     SPECIMEN:  Source of Specimen:  Rt upper pole cyst wall; Rt partial nephrectomy  DISPOSITION OF SPECIMEN:  PATHOLOGY  COUNTS:  YES  TOURNIQUET:  * No tourniquets in log *  DICTATION: .Other Dictation: Dictation Number [id number]  PLAN OF CARE: Admit to inpatient   PATIENT DISPOSITION:  PACU - hemodynamically stable.   Delay start of Pharmacological VTE agent (>24hrs) due to surgical blood loss or risk of bleeding: yes

## 2021-01-20 NOTE — Anesthesia Procedure Notes (Signed)
Procedure Name: Intubation Date/Time: 01/20/2021 12:36 PM Performed by: Maxwell Caul, CRNA Pre-anesthesia Checklist: Patient identified, Emergency Drugs available, Suction available and Patient being monitored Patient Re-evaluated:Patient Re-evaluated prior to induction Oxygen Delivery Method: Circle system utilized Preoxygenation: Pre-oxygenation with 100% oxygen Induction Type: IV induction Ventilation: Mask ventilation without difficulty Laryngoscope Size: Mac and 4 Grade View: Grade I Tube type: Oral Tube size: 7.5 mm Number of attempts: 1 Airway Equipment and Method: Stylet Placement Confirmation: ETT inserted through vocal cords under direct vision, positive ETCO2 and breath sounds checked- equal and bilateral Secured at: 21 cm Tube secured with: Tape Dental Injury: Teeth and Oropharynx as per pre-operative assessment

## 2021-01-21 ENCOUNTER — Other Ambulatory Visit: Payer: Self-pay

## 2021-01-21 ENCOUNTER — Encounter (HOSPITAL_COMMUNITY): Payer: Self-pay | Admitting: Urology

## 2021-01-21 LAB — BASIC METABOLIC PANEL
Anion gap: 6 (ref 5–15)
BUN: 14 mg/dL (ref 8–23)
CO2: 25 mmol/L (ref 22–32)
Calcium: 8.6 mg/dL — ABNORMAL LOW (ref 8.9–10.3)
Chloride: 103 mmol/L (ref 98–111)
Creatinine, Ser: 0.9 mg/dL (ref 0.61–1.24)
GFR, Estimated: >60 mL/min (ref >60)
Glucose, Bld: 216 mg/dL — ABNORMAL HIGH (ref 70–99)
Potassium: 5.1 mmol/L (ref 3.5–5.1)
Sodium: 134 mmol/L — ABNORMAL LOW (ref 135–145)

## 2021-01-21 LAB — GLUCOSE, CAPILLARY
Glucose-Capillary: 175 mg/dL — ABNORMAL HIGH (ref 70–99)
Glucose-Capillary: 257 mg/dL — ABNORMAL HIGH (ref 70–99)
Glucose-Capillary: 177 mg/dL — ABNORMAL HIGH (ref 70–99)
Glucose-Capillary: 225 mg/dL — ABNORMAL HIGH (ref 70–99)

## 2021-01-21 LAB — HEMOGLOBIN AND HEMATOCRIT, BLOOD
HCT: 39.1 % (ref 39.0–52.0)
Hemoglobin: 12.7 g/dL — ABNORMAL LOW (ref 13.0–17.0)

## 2021-01-21 MED ORDER — BISACODYL 10 MG RE SUPP
10.0000 mg | Freq: Once | RECTAL | Status: AC
  Administered 2021-01-21: 10 mg via RECTAL
  Filled 2021-01-21: qty 1

## 2021-01-21 NOTE — Op Note (Signed)
Zachary Lopez, Zachary Lopez MEDICAL RECORD NO: [id number] ACCOUNT NO: [id number] DATE OF BIRTH: 1955/10/28 FACILITY: [facility] LOCATION: [id number] PHYSICIAN: [name], MD  Operative Report   DATE OF PROCEDURE: 01/20/2021  PREOPERATIVE DIAGNOSIS:  Enlarging right renal mass, right renal cyst.  PROCEDURE PERFORMED:  1.  Robotic-assisted laparoscopic right partial nephrectomy. 2.  Right renal cyst decortication.  ASSISTANT:  Debbrah Alar, PA  ESTIMATED BLOOD LOSS:  500 mL  Warm ischemia time 25 minutes.  FINDINGS:  1.  Two arteries, one vein inferior artery early branching, right renovascular anatomy. 2.  Predominantly exophytic, solid right superolateral renal mass. 3.  Simple-appearing right upper pole kidney.  DRAINS: 1.  Jackson-Pratt drain to bulb suction. 2.  Foley catheter to straight drain.  INDICATIONS:  The patient is a pleasant 65 year old male with history of congenital deafness and he was found to have a small right upper pole solid renal mass. On interval imaging, this was clearly progressive, although it remained quite small. Despite  meeting criteria for surveillance, the patient adamantly wished to proceed with curative intent therapy with partial nephrectomy.  He does have ipsilateral renal cyst, one larger superior pole without complex features. He wished to have concomitant  decortication of this as well.  Informed consent was obtained and placed in the medical record.   PROCEDURE IN DETAIL:  The patient being himself verified, the procedure being right robotic partial nephrectomy was confirmed.  Procedure timeout was performed.  Intravenous antibiotics were administered.  General endotracheal anesthesia was induced.   The patient was placed into a right side up, full flank position, pulling 15 degrees of table flexion, superior arm elevator, axillary roll, sequential devices, bottom leg bent, top leg straight.  Foley catheter was placed free to straight drain.  He was   further fastened to the operative table using 3-inch tape over foam padding across the supraxiphoid chest and his pelvis and a sterile field was created, prepped and draped the patient's right flank and abdomen using chlorhexidine gluconate.  Next, a  high-flow, low-pressure pneumoperitoneum was obtained using Veress technique in the right lower quadrant, having passed the aspiration and drop test.  An 8 mm robotic camera port was then placed in position approximately 1 handbreadth superolateral to  the umbilicus.  Laparoscopic examination of peritoneal cavity revealed some loose adhesions of the hepatic flexure into the lateral abdomen.  Otherwise, unremarkable.  Additional ports were placed as follows:  Right subcostal 8 mm robotic port, right far  lateral 8 mm robotic port approximately 4 fingerbreadths superomedial to the anterior iliac spine.  Right paramedian inferior robotic port, 1/2 handbreadth superior to the pubic ramus.  Two 12 mm assistant port sites in the midline, one approximately 2  fingerbreadths inferior to the plane of the camera port, another 2 fingerbreadths superior and finally a subxiphoid 5 mm port for later liver retractor.  Robot was docked and passed electronic checks.  Initial attention was directed at limited  adhesiolysis.  Very careful adhesiolysis was performed of some adhesions between the hepatic flexure and the lateral abdomen, likely postoperative in nature from his prior cholecystectomy.  These were relatively loose and simple.  There was some loose  adhesions between the inferior aspect of his liver and omentum as well as the lateral abdominal wall and these were taken down to gain more mobility of the liver, which was then gently retracted superiorly by placing a self-locking grasper at the  inferior liver edge, retracting gently superiorly allowing the liver to retract away from  the anterior surface of Gerota's fascia.  Retroperitoneum was then developed by incising  lateral to the ascending colon from the area of the cecum towards the area  of the hepatic flexure and this was very carefully swept medially.  Duodenum was encountered and kocherized medially such that it lie medial to the lateral border of the inferior vena cava. Lower pole of the kidney was identified, placed on gentle  lateral traction.  Dissection proceeded medial to this. There was a significant amount of retroperitoneal fat.  It was somewhat sticky.  The ureter was encountered, placed on gentle lateral traction.  Dissection proceeded with the triangle of the ureter  and psoas musculature towards the area of the renal hilum.  Renal hilum was somewhat complex as anticipated with two arteries, the inferior being very early branching.  These were circumferentially mobilized and marked separately with vessel loops.   Attention was directed at identification of the renal mass and the upper pole cyst.  Dissection proceeded directly on to anterior superior aspect of the kidney, which was then carefully defatted superiorly and the cyst edge was encountered and the  circumferential aspect of the perirenal fat in contact with this was dissected away. Using this as a landmark, the right superolateral renal mass was next identified and defatted circumferentially such that there was approximately 1.5 cm rim of normal  parenchyma around the gross aspect of the mass.  Next, intraoperative ultrasound was performed using drop down probe.  Intraoperative ultrasound revealed a solid right superolateral renal mass as anticipated, approximately 50% exophytic and identified  the mass. The upper pole cyst was controlled by excising its wall and flushed with the parenchyma, setting aside a sample of the cyst wall for permanent pathology and the base of this was fulgurated carefully. During fulguration, a small, but impressive  vessel was tangentially cauterized for significant bleeding.  As such, the renal arteries were  clamped as previously planned, which revealed an excellent hemostatic control of this small vessel.  Attention was directed to the partial nephrectomy, which  was performed using cold dissection keeping what appeared to be a rim of normal parenchyma with the small renal mass. This was set aside for later retrieval.  First, the first layer rhenorrhaphy was performed using running 3-0 V-Loc x2 oversewing several impressive venous  sinuses.  This resulted in excellent hemostasis; however, did result in approximately 400 mL of blood loss from this.  Next, a half size Surgicel bolster was placed and parenchymal apposition sutures of 0 Vicryl sandwiched between Hem-o-loks and Lapra-Ty was  applied.  This resulted in excellent hemostasis of the partial nephrectomy site.  A separate V-loc was used to place figure-of-eight sutures in the tangentially incised vessel of the upper pole cyst resulting in excellent hemostasis there. Bulldogs  were then released for a total warm ischemia time of 25 minutes.  Hemostasis was quite good. Gerota's was reapproximated using another 3-0 V-Loc suture after a third parenchymal apposition suture was placed on the renorrhaphy. At this point, sponge and  needle counts were correct.  The vessel loops were removed.  The partial nephrectomy specimen was placed into an EndoCatch bag for later retrieval.  Robot was then undocked.  The superior most 12 mm assistant port site was closed at the level of fascia  using Carter-Thomason suture passer and 0 Vicryl.  Liver retractor was removed.  There was no evidence of hepatic injury.  Specimen was retrieved by extending the previous inferior most assistant port site, distance  approximately 1.5 cm total, dilating  with the surgeon's fingers and removing the partial nephrectomy specimen, setting it aside for permanent pathology.  The extraction site was closed with fascia using figure-of-eight PDS x2, followed by reapproximation of Scarpa with  running Vicryl.  All  incision sites were infiltrated with dilute lipolyzed Marcaine and closed at the level of skin using subcuticular Monocryl and Dermabond.  Drain stitch was applied to a closed suction drain that was brought up of the previous lateral most robotic port  site.  Procedure was terminated.  The patient tolerated the procedure well with no immediate apparent complications.  The patient was taken to postanesthesia care in stable condition  Please note first assistant, Debbrah Alar, was crucial for all portions of surgery today.  She provided invaluable retraction, suctioning, vascular clamping, specimen manipulation, and general first assistance.   PAA D: 01/20/2021 3:46:39 pm T: 01/21/2021 7:54:00 am  JOB: [id number]/ [id number]

## 2021-01-21 NOTE — Progress Notes (Signed)
1 Day Post-Op Subjective: Patient has had some mild dizziness with ambulation per RN and his sister.  His vitals are stable and he does not appear to have orthostasis. He is voiding well after foley removal.  Tolerating regular diet. Pain is well controlled.   Objective: Vital signs in last 24 hours: Temp:  [97.5 F (36.4 C)-98.4 F (36.9 C)] 97.5 F (36.4 C) (11/10 0619) Pulse Rate:  [85-121] 85 (11/10 0619) Resp:  [13-18] 16 (11/10 0619) BP: (106-167)/(68-90) 114/78 (11/10 0619) SpO2:  [91 %-96 %] 96 % (11/10 0619)  Intake/Output from previous day: 11/09 0701 - 11/10 0700 In: 3548.8 [I.V.:3195.8; IV Piggyback:350] Out: 2260 [WERXV:4008; Drains:110; Blood:500] Intake/Output this shift: Total I/O In: -  Out: 255 [Urine:175; Drains:80]  Physical Exam:  General:alert, cooperative, and no distress Cardiovascular:  Lungs:  GI: soft; mildly tender Incisions: C/D/I Urine: Extremities:  Lab Results: Recent Labs    01/20/21 1935 01/21/21 0446  HGB 13.7 12.7*  HCT 41.1 39.1   BMET Recent Labs    01/21/21 0446  NA 134*  K 5.1  CL 103  CO2 25  GLUCOSE 216*  BUN 14  CREATININE 0.90  CALCIUM 8.6*   No results for input(s): LABPT, INR in the last 72 hours. No results for input(s): LABURIN in the last 72 hours. Results for orders placed or performed in visit on 01/18/21  SARS Coronavirus 2 (TAT 6-24 hrs)     Status: None   Collection Time: 01/18/21 12:00 AM  Result Value Ref Range Status   SARS Coronavirus 2 RESULT: NEGATIVE  Final    Comment: RESULT: NEGATIVESARS-CoV-2 INTERPRETATION:A NEGATIVE  test result means that SARS-CoV-2 RNA was not present in the specimen above the limit of detection of this test. This does not preclude a possible SARS-CoV-2 infection and should not be used as the  sole basis for patient management decisions. Negative results must be combined with clinical observations, patient history, and epidemiological information. Optimum specimen types  and timing for peak viral levels during infections caused by SARS-CoV-2  have not been determined. Collection of multiple specimens or types of specimens may be necessary to detect virus. Improper specimen collection and handling, sequence variability under primers/probes, or organism present below the limit of detection may  lead to false negative results. Positive and negative predictive values of testing are highly dependent on prevalence. False negative test results are more likely when prevalence of disease is high.The expected result is NEGATIVE.Fact S heet for  Healthcare Providers: LocalChronicle.no Sheet for Patients: SalonLookup.es Reference Range - Negative     Studies/Results: No results found.  Assessment/Plan: 1 Day Post-Op, Procedure(s) (LRB): XI ROBOTIC ASSITED PARTIAL NEPHRECTOMY AND RENAL CYST DECORTICATION (Right)  Doing well.  He is having some mild dizziness with ambulation. Ambulate IS D/C JP Recheck Hg in am Continue regular diet Dulcolax supp Transition to PO pain meds Plan for d/c home tomorrow     LOS: 1 day   Debbrah Alar 01/21/2021, 3:39 PM

## 2021-01-21 NOTE — Progress Notes (Signed)
Pt up to bathroom and Pt had BM and also voided in the toilet.

## 2021-01-22 LAB — GLUCOSE, CAPILLARY: Glucose-Capillary: 172 mg/dL — ABNORMAL HIGH (ref 70–99)

## 2021-01-22 NOTE — Discharge Summary (Signed)
Date of admission: 01/20/2021  Date of discharge: 01/22/2021  Admission diagnosis: Right renal mass and non complex renal cysts  Discharge diagnosis: same  Secondary diagnoses: Deafness, anxiety, BPH, CKD, DM II, HTN  History and Physical: For full details, please see admission history and physical. Briefly, Zachary Lopez is a 65 y.o. year old patient with a 2cm solid Rt upper lateral 50% exophytic enhancing renal mass incidetnal on CT 05/2020 at time of small ureteral stone. 2 artery, 2 vein right renovascular anatomy. He also has LUP 5cm, LLP 2cm, RUP 3cm B1 cysts by CT and MRI 2022, unlike his above mass, these have no cancerous characteristics on imaging.   Hospital Course: Pt was admitted and taken to the OR on 01/20/21 for robotic assisted lap right partial nephrectomy and right renal cyst decortication.  Pt tolerated the procedure well and was hemodynamically stable throughout.  He was transferred from the OR to PACU and then to the floor without issue.  Post op course progressed as expected.  On POD 1 foley was d/c'd and  his diet was advanced.  JP output was appropriate and drain was d/c'd.  UO was excellent and vitals remained stable.  He was able to void and tolerate a regular diet on POD 1.  He did have some dizziness with ambulation, however.  There was no evidence of orthostasis and H/H was stable postop.  He felt more comfortable staying an additional night for observation and this was felt to be the safe/best option for the pt.  On POD 2 he was doing very well.  Dizziness had resolved. He was passing flatus and had a small BM.  He is ambulating, voiding, and tolerating a regular diet.  He was felt safe for discharge home.   Laboratory values:  Recent Labs    01/20/21 1935 01/21/21 0446  HGB 13.7 12.7*  HCT 41.1 39.1   Recent Labs    01/21/21 0446  CREATININE 0.90    Disposition: Home  Discharge instruction: The patient was instructed to be ambulatory but told to refrain  from heavy lifting, strenuous activity, or driving.   Discharge medications:  Allergies as of 01/22/2021       Reactions   Cefepime Rash, Hives   Vancomycin Anaphylaxis        Medication List     STOP taking these medications    aspirin EC 81 MG tablet       TAKE these medications    cetirizine 10 MG tablet Commonly known as: ZYRTEC Take 10 mg by mouth daily.   docusate sodium 100 MG capsule Commonly known as: COLACE Take 1 capsule (100 mg total) by mouth 2 (two) times daily.   escitalopram 5 MG tablet Commonly known as: LEXAPRO Take 5 mg by mouth daily.   HYDROcodone-acetaminophen 5-325 MG tablet Commonly known as: Norco Take 1-2 tablets by mouth every 6 (six) hours as needed for moderate pain or severe pain.   losartan 25 MG tablet Commonly known as: COZAAR Take 1 tablet (25 mg total) by mouth daily.   metFORMIN 500 MG 24 hr tablet Commonly known as: GLUCOPHAGE-XR Take 500 mg by mouth in the morning and at bedtime.   nitroGLYCERIN 0.4 MG SL tablet Commonly known as: NITROSTAT Place 1 tablet (0.4 mg total) under the tongue every 5 (five) minutes as needed for chest pain.   rosuvastatin 40 MG tablet Commonly known as: CRESTOR Take 1 tablet (40 mg total) by mouth daily.   tamsulosin 0.4 MG Caps  capsule Commonly known as: FLOMAX Take 0.4 mg by mouth daily.        Followup:   Follow-up Information     [name], MD Follow up on 02/08/2021.   Specialty: Urology Why: at 1:00 Contact information: [place] [address] [phone]

## 2021-01-22 NOTE — TOC Initial Note (Signed)
Transition of Care [facility]) - Initial/Assessment Note    Patient Details  Name: Zachary Lopez MRN: [id number] Date of Birth: Feb 14, 1956  [facility] CM/SW Contact:    Leeroy Cha, RN Phone Number: 01/22/2021, 7:54 AM  Clinical Narrative:                 Chief Complaint: Pre-OP RIGHT Partial Nephrectomy   HPI:    1 - Right Renal Mass - 2cm solid Rt upper lateral 50% exophytic enhancing renal mass incidetnal on CT 05/2020 at time of small ureteral stone. 2 artery, 2 vein right renovascular anatomy.   Recent Surveillance:  11/2020 - CT - stable 2cm, stone free.    2 -  Non-Complex Renal Cysts - LUP 5cm, LLP 2cm, RUP 3cm B1 cysts by CT and MRI 2022, unlike his above mass, these have no cancerous characteristics onimaging.   PMH sig for IDDM2 (A1c 7-8), total knee, lap appy, lap chole, congenital TOTAL DEAFNESS. HIs sister [name] lives near and is very involved and is POA and primary contact. 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 His PCP is [name] MD.   Today " Ignatius " is seen to proceed with RIGHT robotic partial nephrectomy.Hgb 15.1, Cr 0.7.   TOC PLAN OF CARE: Lives alone plan is to return to home with self care, will follow for if hhc needs. Following for progression. Expected Discharge Plan: Home/Self Care Barriers to Discharge: Continued Medical Work up   Patient Goals and CMS Choice Patient states their goals for this hospitalization and ongoing recovery are:: i would like to return home CMS Medicare.gov Compare Post Acute Care list provided to:: Patient Choice offered to / list presented to : Patient  Expected Discharge Plan and Services Expected Discharge Plan: Home/Self Care   Discharge Planning Services: CM Consult   Living arrangements for the past 2 months: Single Family Home                                      Prior Living Arrangements/Services Living arrangements for the past 2 months: Single Family  Home Lives with:: Self Patient language and need for interpreter reviewed:: Yes Do you feel safe going back to the place where you live?: Yes      Need for Family Participation in Patient Care: No (Comment) Care giver support system in place?: No (comment)   Criminal Activity/Legal Involvement Pertinent to Current Situation/Hospitalization: No - Comment as needed  Activities of Daily Living Home Assistive Devices/Equipment: None ADL Screening (condition at time of admission) Patient's cognitive ability adequate to safely complete daily activities?: Yes Is the patient deaf or have difficulty hearing?: Yes Does the patient have difficulty seeing, even when wearing glasses/contacts?: No Does the patient have difficulty concentrating, remembering, or making decisions?: No Patient able to express need for assistance with ADLs?: Yes Does the patient have difficulty dressing or bathing?: No Independently performs ADLs?: Yes (appropriate for developmental age) Does the patient have difficulty walking or climbing stairs?: No Weakness of Legs: None Weakness of Arms/Hands: None  Permission Sought/Granted                  Emotional Assessment Appearance:: Appears stated age Attitude/Demeanor/Rapport: Engaged Affect (typically observed): Calm Orientation: : Oriented to Place, Oriented to Self, Oriented to  Time, Oriented to Situation Alcohol / Substance Use: Not Applicable Psych Involvement: No (comment)  Admission diagnosis:  Renal mass [N28.89] Patient Active Problem List   Diagnosis Date Noted   Renal mass 01/20/2021   Coronary artery disease involving native coronary artery of native heart without angina pectoris 10/13/2020   Elevated blood pressure reading 10/13/2020   Arthritis 10/05/2020   Cellulitis 47/65/4650   Complication of anesthesia 10/05/2020   Meningitis 10/05/2020   PONV (postoperative nausea and vomiting) 10/05/2020   Type 2 diabetes mellitus without  complication, without long-term current use of insulin ([place]) 07/10/2020   Precordial pain 07/10/2020   Abnormal EKG 07/10/2020   Mixed hyperlipidemia 07/10/2020   Family history of premature CAD 07/10/2020   Hypertension 07/10/2020   Chest pain at rest 06/25/2020   Anxiety 11/07/2019   Controlled type 2 diabetes mellitus with hyperglycemia, without long-term current use of insulin ([place]) 08/13/2019   Seasonal allergic rhinitis due to pollen 06/24/2019   Benign prostatic hyperplasia with urinary frequency 11/27/2017   Mild episode of recurrent major depressive disorder ([place]) 11/27/2017   Malaise and fatigue 11/27/2017   [place] 04/04/2017   Essential hypertension 03/29/2016   High risk medication use 03/29/2016   Prediabetes 03/29/2016   Deaf, bilateral 01/28/2016   Hyperlipidemia 01/28/2016   Primary osteoarthritis of right hip 07/08/2014   Contracture of knee joint 12/21/2011   Knee pain 12/21/2011   Infection of prosthetic left knee joint ([place]) 12/11/2010   PCP:  Gweneth Fritter, FNP Pharmacy:   [id number] - [place], [place] - [address] [facility] [place] [address] Phone: [phone] Fax: [phone]     Social Determinants of Health (SDOH) Interventions    Readmission Risk Interventions No flowsheet data found.

## 2021-01-27 LAB — SURGICAL PATHOLOGY

## 2021-03-29 ENCOUNTER — Ambulatory Visit: Payer: Medicare HMO | Admitting: Podiatry

## 2021-04-05 ENCOUNTER — Ambulatory Visit: Payer: Medicare HMO | Admitting: Podiatry

## 2021-04-05 ENCOUNTER — Encounter: Payer: Self-pay | Admitting: Podiatry

## 2021-04-05 ENCOUNTER — Other Ambulatory Visit: Payer: Self-pay

## 2021-04-05 DIAGNOSIS — M79609 Pain in unspecified limb: Secondary | ICD-10-CM

## 2021-04-05 DIAGNOSIS — E119 Type 2 diabetes mellitus without complications: Secondary | ICD-10-CM | POA: Diagnosis not present

## 2021-04-05 DIAGNOSIS — B351 Tinea unguium: Secondary | ICD-10-CM | POA: Diagnosis not present

## 2021-04-05 NOTE — Progress Notes (Signed)
°  Subjective:  Patient ID: Zachary Lopez, male    DOB: [birth year],  MRN: [id number]  Chief Complaint  Patient presents with   Nail Problem    I am here to get the toenails trimmed    66 y.o. male presents with the above complaint. History confirmed with patient. Nails have gotten thick since last visit. Denies other pedal issues.  Objective:  Physical Exam: warm, good capillary refill, nail exam onychomycosis of the toenails, no trophic changes or ulcerative lesions. DP pulses palpable, PT pulses palpable, and protective sensation intact  No images are attached to the encounter.  Assessment:   1. Pain due to onychomycosis of nail   2. Diabetes mellitus without complication ([place])    Plan:  Patient was evaluated and treated and all questions answered.  Onychomycosis -Nails palliatively debrided secondary to pain  Procedure: Nail Debridement Type of Debridement: manual, sharp debridement. Instrumentation: Nail nipper, rotary burr. Number of Nails: 10    Return in about 3 months (around 07/04/2021) for Diabetic Foot Care.

## 2021-07-05 ENCOUNTER — Ambulatory Visit: Payer: Medicare HMO | Admitting: Podiatry

## 2021-07-08 ENCOUNTER — Encounter: Payer: Self-pay | Admitting: Podiatry

## 2021-07-08 ENCOUNTER — Ambulatory Visit (INDEPENDENT_AMBULATORY_CARE_PROVIDER_SITE_OTHER): Payer: Medicare HMO | Admitting: Podiatry

## 2021-07-08 DIAGNOSIS — E119 Type 2 diabetes mellitus without complications: Secondary | ICD-10-CM

## 2021-07-08 DIAGNOSIS — B351 Tinea unguium: Secondary | ICD-10-CM | POA: Diagnosis not present

## 2021-07-08 DIAGNOSIS — M79609 Pain in unspecified limb: Secondary | ICD-10-CM | POA: Diagnosis not present

## 2021-07-15 ENCOUNTER — Other Ambulatory Visit: Payer: Self-pay | Admitting: Cardiology

## 2021-07-18 NOTE — Progress Notes (Signed)
?  Subjective:  ?Patient ID: Zachary Lopez, male    DOB: 09-08-1955,  MRN: [id number] ? ?Zachary Lopez presents to clinic today for preventative diabetic foot care and painful thick toenails that are difficult to trim. Pain interferes with ambulation. Aggravating factors include wearing enclosed shoe gear. Pain is relieved with periodic professional debridement. ? ?He is accompanied by ASL interpreter, Zachary Lopez, on today's visit. ? ?New problem(s): None.  ? ?PCP is Zachary Fritter, FNP , and last visit was June 09, 2021. ? ?Allergies  ?Allergen Reactions  ? Cefepime Rash and Hives  ? Vancomycin Anaphylaxis  ? ? ?Review of Systems: Negative except as noted in the HPI. ? ?Objective: No changes noted in today's physical examination. ? ?There were no vitals filed for this visit. ?Constitutional Patient is a pleasant 66 y.o. Caucasian male WD, WN in NAD. AAO x 3.  ?Vascular Capillary fill time to digits <3 seconds b/l lower extremities. Faintly palpable DP pulse(s) b/l lower extremities. Faintly palpable PT pulse(s) b/l lower extremities. Pedal hair sparse. Lower extremity skin temperature gradient within normal limits. No pain with calf compression b/l. No edema noted b/l lower extremities. No cyanosis or clubbing noted.  ?Neurologic Normal speech. Protective sensation intact 5/5 intact bilaterally with 10g monofilament b/l. Vibratory sensation intact b/l.  ?Dermatologic Pedal skin with normal turgor, texture and tone b/l lower extremities No open wounds b/l lower extremities No interdigital macerations b/l lower extremities Toenails 1-5 right, L hallux, L 3rd toe, L 4th toe, and L 5th toe elongated, discolored, dystrophic, thickened, and crumbly with subungual debris and tenderness to dorsal palpation. Anonychia noted L 2nd toe. Nailbed(s) epithelialized.  There is evidence of subacute subungual hematoma of the L 3rd toe. Nailplate remains adhered. There is no  tenderness to palpation.  ?Orthopedic: Normal muscle strength 5/5  to all lower extremity muscle groups bilaterally. No pain crepitus or joint limitation noted with ROM b/l. No gross bony deformities bilaterally.  ? ? ?  Latest Ref Rng & Units 01/12/2021  ?  2:49 PM  ?Hemoglobin A1C  ?Hemoglobin-A1c 4.8 - 5.6 % 8.9    ? ?Assessment/Plan: ?1. Pain due to onychomycosis of nail   ?2. Diabetes mellitus without complication ([place])   ?  ?-Patient was evaluated and treated. All patient's and/or POA's questions/concerns answered on today's visit. ?-Mycotic toenails 1-5 bilaterally were debrided in length and girth with sterile nail nippers and dremel without incident. ?-Patient/POA to call should there be question/concern in the interim.  ? ?Return in about 3 months (around 10/07/2021). ? ?Marzetta Board, DPM  ?

## 2021-08-03 ENCOUNTER — Other Ambulatory Visit (HOSPITAL_COMMUNITY): Payer: Self-pay | Admitting: Urology

## 2021-08-03 ENCOUNTER — Ambulatory Visit (HOSPITAL_COMMUNITY)
Admission: RE | Admit: 2021-08-03 | Discharge: 2021-08-03 | Disposition: A | Discharge status: Home / Self Care | Payer: Medicare HMO | Source: Ambulatory Visit | Attending: Urology | Admitting: Urology

## 2021-08-03 DIAGNOSIS — C641 Malignant neoplasm of right kidney, except renal pelvis: Secondary | ICD-10-CM

## 2021-10-07 ENCOUNTER — Ambulatory Visit: Payer: Medicare HMO | Admitting: Podiatry

## 2021-10-07 DIAGNOSIS — M2012 Hallux valgus (acquired), left foot: Secondary | ICD-10-CM | POA: Diagnosis not present

## 2021-10-07 DIAGNOSIS — B351 Tinea unguium: Secondary | ICD-10-CM | POA: Diagnosis not present

## 2021-10-07 DIAGNOSIS — M2011 Hallux valgus (acquired), right foot: Secondary | ICD-10-CM

## 2021-10-07 DIAGNOSIS — M79609 Pain in unspecified limb: Secondary | ICD-10-CM | POA: Diagnosis not present

## 2021-10-07 DIAGNOSIS — E119 Type 2 diabetes mellitus without complications: Secondary | ICD-10-CM

## 2021-10-10 ENCOUNTER — Other Ambulatory Visit: Payer: Self-pay | Admitting: Cardiology

## 2021-10-12 NOTE — Progress Notes (Signed)
  Subjective:  Patient ID: Zachary Lopez, male    DOB: 10/22/1955,  MRN: [id number]  Patient visit with sign language interpreter present.  Craigory Toste presents to clinic today for for annual diabetic foot examination and painful elongated mycotic toenails 1-5 bilaterally which are tender when wearing enclosed shoe gear. Pain is relieved with periodic professional debridement.  Patient denies any h/o wounds.  He denies numbnss, tingling or burning of digits.  Last known HgA1c was unknown.  Patient did not check blood glucose today.  New problem(s): None.   PCP is Gweneth Fritter, FNP , and last visit was  Jul 16, 2021  Allergies  Allergen Reactions   Cefepime Rash and Hives   Vancomycin Anaphylaxis   Review of Systems: Negative except as noted in the HPI.  Objective:  There were no vitals filed for this visit. Constitutional Patient is a pleasant 66 y.o. Caucasian male WD, WN in NAD. AAO x 3.  Vascular Capillary fill time to digits <3 seconds b/l lower extremities. Faintly palpable DP pulse(s) b/l lower extremities. Faintly palpable PT pulse(s) b/l lower extremities. Pedal hair sparse. Lower extremity skin temperature gradient within normal limits. No pain with calf compression b/l. No edema noted b/l lower extremities. No cyanosis or clubbing noted.  Neurologic Normal speech. Protective sensation intact 5/5 intact bilaterally with 10g monofilament b/l. Vibratory sensation intact b/l.  Dermatologic Pedal skin with normal turgor, texture and tone b/l lower extremities No open wounds b/l lower extremities No interdigital macerations b/l lower extremities Toenails 1-5 right, L hallux, L 3rd toe, L 4th toe, and L 5th toe elongated, discolored, dystrophic, thickened, and crumbly with subungual debris and tenderness to dorsal palpation. Anonychia noted L 2nd toe. Nailbed(s) epithelialized.   Orthopedic: Normal muscle strength 5/5 to all lower extremity muscle groups bilaterally. No pain crepitus  or joint limitation noted with ROM b/l. HAV with bunion deformity b/l.      Latest Ref Rng & Units 01/12/2021    2:49 PM  Hemoglobin A1C  Hemoglobin-A1c 4.8 - 5.6 % 8.9    Assessment/Plan: 1. Pain due to onychomycosis of nail   2. Hallux valgus, acquired, bilateral   3. Diabetes mellitus without complication ([place])   4. Encounter for diabetic foot exam ([place])     -Examined patient. -Diabetic foot examination performed today. -Continue foot and shoe inspections daily. Monitor blood glucose per PCP/Endocrinologist's recommendations. -Patient to continue soft, supportive shoe gear daily. -Mycotic toenails 1-5 bilaterally were debrided in length and girth with sterile nail nippers and dremel without incident. -Patient/POA to call should there be question/concern in the interim.   No follow-ups on file.  Marzetta Board, DPM

## 2022-01-04 ENCOUNTER — Other Ambulatory Visit: Payer: Self-pay | Admitting: Cardiology

## 2022-01-20 ENCOUNTER — Ambulatory Visit: Payer: Medicare HMO | Admitting: Podiatry

## 2022-01-20 ENCOUNTER — Encounter: Payer: Self-pay | Admitting: Podiatry

## 2022-01-20 DIAGNOSIS — B351 Tinea unguium: Secondary | ICD-10-CM

## 2022-01-20 DIAGNOSIS — E119 Type 2 diabetes mellitus without complications: Secondary | ICD-10-CM

## 2022-01-20 DIAGNOSIS — M79609 Pain in unspecified limb: Secondary | ICD-10-CM | POA: Diagnosis not present

## 2022-01-20 NOTE — Progress Notes (Signed)
  Subjective:  Patient ID: Zachary Lopez, male    DOB: 28-Nov-1955,  MRN: [id number]  Zachary Lopez presents to clinic today for preventative diabetic foot care and painful elongated mycotic toenails 1-5 bilaterally which are tender when wearing enclosed shoe gear. Pain is relieved with periodic professional debridement.   Patient is deaf and uses sign language interpreter who is present on today's visit. Chief Complaint  Patient presents with   Nail Problem    Nail Trim  Not Diabetic  PCP - Isaiah Blakes , last OV 2 weeks ago   New problem(s): None.   PCP is Goins, Gillis Santa, FNP.  Allergies  Allergen Reactions   Cefepime Rash and Hives   Vancomycin Anaphylaxis    Review of Systems: Negative except as noted in the HPI.  Objective: No changes noted in today's physical examination.  Zachary Lopez is a pleasant 66 y.o. male in NAD. AAO x 3.  Vascular Capillary fill time to digits <3 seconds b/l lower extremities. Faintly palpable DP pulse(s) b/l lower extremities. Faintly palpable PT pulse(s) b/l lower extremities. Pedal hair sparse. Lower extremity skin temperature gradient within normal limits. No pain with calf compression b/l. No edema noted b/l lower extremities. No cyanosis or clubbing noted.  Neurologic Normal speech. Protective sensation intact 5/5 intact bilaterally with 10g monofilament b/l. Vibratory sensation intact b/l.  Dermatologic Pedal skin with normal turgor, texture and tone b/l lower extremities No open wounds b/l lower extremities No interdigital macerations b/l lower extremities Toenails 1-5 right, L hallux, L 3rd toe, L 4th toe, and L 5th toe elongated, discolored, dystrophic, thickened, and crumbly with subungual debris and tenderness to dorsal palpation. Anonychia noted L 2nd toe. Nailbed(s) epithelialized.   Orthopedic: Normal muscle strength 5/5 to all lower extremity muscle groups bilaterally. No pain crepitus or joint limitation noted with ROM b/l. HAV with bunion  deformity b/l.   Assessment/Plan: 1. Pain due to onychomycosis of nail   2. Diabetes mellitus without complication ([place])     No orders of the defined types were placed in this encounter.   -Patient was evaluated and treated. All patient's and/or POA's questions/concerns answered on today's visit. -Continue diabetic foot care principles: inspect feet daily, monitor glucose as recommended by PCP and/or Endocrinologist, and follow prescribed diet per PCP, Endocrinologist and/or dietician. -Continue supportive shoe gear daily. -Toenails 1-5 b/l were debrided in length and girth with sterile nail nippers and dremel without iatrogenic bleeding.  -Patient/POA to call should there be question/concern in the interim.   Return in about 3 months (around 04/22/2022).  Marzetta Board, DPM

## 2022-01-26 ENCOUNTER — Other Ambulatory Visit: Payer: Self-pay | Admitting: Cardiology

## 2022-05-19 ENCOUNTER — Ambulatory Visit: Payer: Medicare HMO | Admitting: Podiatry

## 2022-07-07 ENCOUNTER — Ambulatory Visit: Payer: Medicare HMO | Admitting: Podiatry

## 2022-07-07 ENCOUNTER — Encounter: Payer: Self-pay | Admitting: Podiatry

## 2022-07-07 DIAGNOSIS — M79609 Pain in unspecified limb: Secondary | ICD-10-CM

## 2022-07-07 DIAGNOSIS — B351 Tinea unguium: Secondary | ICD-10-CM

## 2022-07-07 DIAGNOSIS — E119 Type 2 diabetes mellitus without complications: Secondary | ICD-10-CM

## 2022-07-07 NOTE — Progress Notes (Signed)
  Subjective:  Patient ID: Zachary Lopez, male    DOB: [birth year],  MRN: [id number]  Zachary Lopez presents to clinic today for preventative diabetic foot care and painful thick toenails that are difficult to trim. Pain interferes with ambulation. Aggravating factors include wearing enclosed shoe gear. Pain is relieved with periodic professional debridement.  Chief Complaint  Patient presents with   diabetic foot care    New problem(s): None.   PCP is Goins, Gwenith Spitz, FNP.  Allergies  Allergen Reactions   Cefepime Rash and Hives   Vancomycin Anaphylaxis   Review of Systems: Negative except as noted in the HPI.  Objective: No changes noted in today's physical examination. There were no vitals filed for this visit. Zachary Lopez is a pleasant 67 y.o. male WD, WN in NAD. AAO x 3.  Vascular Capillary fill time to digits <3 seconds b/l lower extremities. Faintly palpable DP pulse(s) b/l lower extremities. Faintly palpable PT pulse(s) b/l lower extremities. Pedal hair sparse. Lower extremity skin temperature gradient within normal limits. No pain with calf compression b/l. No edema noted b/l lower extremities. No cyanosis or clubbing noted.  Neurologic Normal speech. Protective sensation intact 5/5 intact bilaterally with 10g monofilament b/l. Vibratory sensation intact b/l.  Dermatologic Pedal skin with normal turgor, texture and tone b/l lower extremities No open wounds b/l lower extremities No interdigital macerations b/l lower extremities   Toenails 1-5 right, L hallux, L 3rd toe, L 4th toe, and L 5th toe elongated, discolored, dystrophic, thickened, and crumbly with subungual debris and tenderness to dorsal palpation.   Anonychia noted L 2nd toe. Nailbed(s) epithelialized.   Orthopedic: Normal muscle strength 5/5 to all lower extremity muscle groups bilaterally. No pain crepitus or joint limitation noted with ROM b/l. HAV with bunion deformity b/l.   Assessment/Plan: 1. Pain due to  onychomycosis of nail   2. Diabetes mellitus without complication (HCC)     -Consent given for treatment as described below: -Examined patient. -Patient is deaf. Visit assisted via sign-language interpreter. -Mycotic toenails 1-5 bilaterally were debrided in length and girth with sterile nail nippers and dremel without incident. -Patient/POA to call should there be question/concern in the interim.   Return in about 3 months (around 10/06/2022).  Freddie Breech, DPM

## 2022-09-27 ENCOUNTER — Other Ambulatory Visit: Payer: Self-pay | Admitting: Cardiology

## 2022-10-05 ENCOUNTER — Ambulatory Visit (INDEPENDENT_AMBULATORY_CARE_PROVIDER_SITE_OTHER): Payer: Medicare HMO | Admitting: Podiatry

## 2022-10-05 DIAGNOSIS — M79609 Pain in unspecified limb: Secondary | ICD-10-CM | POA: Diagnosis not present

## 2022-10-05 DIAGNOSIS — B351 Tinea unguium: Secondary | ICD-10-CM | POA: Diagnosis not present

## 2022-10-05 NOTE — Progress Notes (Signed)
    Subjective:  Patient ID: Zachary Lopez, male    DOB: July 09, 1955,  MRN: [id number]   Zachary Lopez presents to clinic today for:  Chief Complaint  Patient presents with   Nail Problem    [facility]  . Patient notes nails are thick, discolored, elongated and painful in shoegear when trying to ambulate.    PCP is Goins, Zachary Spitz, FNP.  Allergies  Allergen Reactions   Cefepime Rash and Hives   Vancomycin Anaphylaxis    Review of Systems: Negative except as noted in the HPI.  Objective:  There were no vitals filed for this visit.  [name] is a pleasant 67 y.o. male in NAD. AAO x 3.  Vascular Examination: Capillary refill time is 3-5 seconds to toes bilateral. Palpable pedal pulses b/l LE. Digital hair present b/l. No pedal edema b/l. Skin temperature gradient WNL b/l. No varicosities b/l. No cyanosis or clubbing noted b/l.   Dermatological Examination: Pedal skin with normal turgor, texture and tone b/l. No open wounds. No interdigital macerations b/l. Toenails x10 are 3mm thick, discolored, dystrophic with subungual debris. There is pain with compression of the nail plates.  They are elongated x10.  There are hyperkeratotic lesions on the plantar medial aspect of the hallux IPJ bilateral.  Assessment/Plan: 1. Pain due to onychomycosis of nail    The mycotic toenails were sharply debrided x10 with sterile nail nippers and a power debriding burr to decrease bulk/thickness and length.    The pinch calluses were shaved with a sterile #[id number] blade.  Return in about 3 months (around 01/05/2023) for [facility].   Clerance Lav, DPM, FACFAS [facility]     [address] [place], [place] [address]                Office [phone]  Fax [phone]

## 2023-01-11 ENCOUNTER — Ambulatory Visit (INDEPENDENT_AMBULATORY_CARE_PROVIDER_SITE_OTHER): Payer: Medicare HMO | Admitting: Podiatry

## 2023-01-11 ENCOUNTER — Encounter: Payer: Self-pay | Admitting: Podiatry

## 2023-01-11 DIAGNOSIS — M79676 Pain in unspecified toe(s): Secondary | ICD-10-CM

## 2023-01-11 DIAGNOSIS — B351 Tinea unguium: Secondary | ICD-10-CM

## 2023-01-11 NOTE — Progress Notes (Signed)
Subjective:  Patient ID: Zachary Lopez, male    DOB: [birth year],  MRN: [id number]  Zachary Lopez presents to clinic today for:  Chief Complaint  Patient presents with   [facility]    DM. Nails. Unsure of last A1c. ASL interpreter present  . Patient notes nails are thick, discolored, elongated and painful in shoegear when trying to ambulate.  A sign language interpreter is present for the examination today.  PCP is Goins, Gwenith Spitz, FNP.  Past Medical History:  Diagnosis Date   Anginal pain (HCC)    Anxiety 11/07/2019   Arthritis    Benign prostatic hyperplasia with urinary frequency 11/27/2017   Cellulitis    Chest pain at rest 06/25/2020   Chronic kidney disease    Complication of anesthesia    Contracture of knee joint 12/21/2011   Controlled type 2 diabetes mellitus with hyperglycemia, without long-term current use of insulin (HCC) 08/13/2019   Deaf, bilateral 01/28/2016   Formatting of this note might be different from the original. Overview:  history of meningitis as a child Formatting of this note might be different from the original. history of meningitis as a child   Essential hypertension 03/29/2016   Formatting of this note might be different from the original. Overview:  monitoring at present per PCP, not on meds at present Formatting of this note might be different from the original. 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   Mute 04/04/2017   PONV (postoperative nausea and vomiting)    Prediabetes 03/29/2016   Primary osteoarthritis of right hip 07/08/2014   Seasonal allergic rhinitis due to pollen 06/24/2019    Past Surgical History:   Procedure Laterality Date   APPENDECTOMY     CHOLECYSTECTOMY     COLONOSCOPY     DENTAL SURGERY     ELBOW SURGERY Left    IRRIGATION AND DEBRIDEMENT KNEE Left 12/11/2010   Surgeon: Kayleen Memos, MD; [facility] Main OR   KNEE ARTHROPLASTY Left    ROBOTIC ASSITED PARTIAL NEPHRECTOMY Right 01/20/2021   Procedure: XI ROBOTIC ASSITED PARTIAL NEPHRECTOMY AND RENAL CYST DECORTICATION;  Surgeon: Sebastian Ache, MD;  Location: [facility] ORS;  Service: Urology;  Laterality: Right;  3 HRS   TOTAL HIP ARTHROPLASTY Right 07/08/2014   Procedure: RIGHT  TOTAL HIP ARTHROPLASTY ANTERIOR APPROACH;  Surgeon: Marcene Corning, MD;  Location: [facility] OR;  Service: Orthopedics;  Laterality: Right;    Allergies  Allergen Reactions   Cefepime Rash and Hives   Vancomycin Anaphylaxis   Review of Systems: Negative except as noted in the HPI.  Objective:  Zachary Lopez is a pleasant 67 y.o. male in NAD. AAO x 3.  Vascular Examination: Capillary refill time is 3-5 seconds to toes bilateral. Palpable pedal pulses b/l LE. Digital hair present b/l.  Skin temperature gradient WNL b/l. No varicosities b/l. No cyanosis noted b/l.   Dermatological Examination: Pedal skin with normal turgor, texture and tone b/l. No open wounds. No interdigital macerations b/l. Toenails x10 are 3mm thick, discolored, dystrophic with subungual debris. There is pain with compression of the nail plates.  They are elongated x10  Assessment/Plan: 1. Pain  due to onychomycosis of nail     The mycotic toenails were sharply debrided x10 with sterile nail nippers and a power debriding burr to decrease bulk/thickness and length.    Return in about 3 months (around 04/13/2023) for [facility].   Clerance Lav, DPM, FACFAS [facility]     [address] [place], [place] [address]                Office [phone]  Fax [phone]

## 2023-01-14 ENCOUNTER — Other Ambulatory Visit: Payer: Self-pay | Admitting: Cardiology

## 2023-04-07 IMAGING — CR DG CHEST 2V
2 series · 2 of 2 positions shown · non-contrast
Comparison: Chest radiograph 03/21/2016

CLINICAL DATA: Right renal neoplasm.

EXAM:
CHEST - 2 VIEW

[w chest pa]
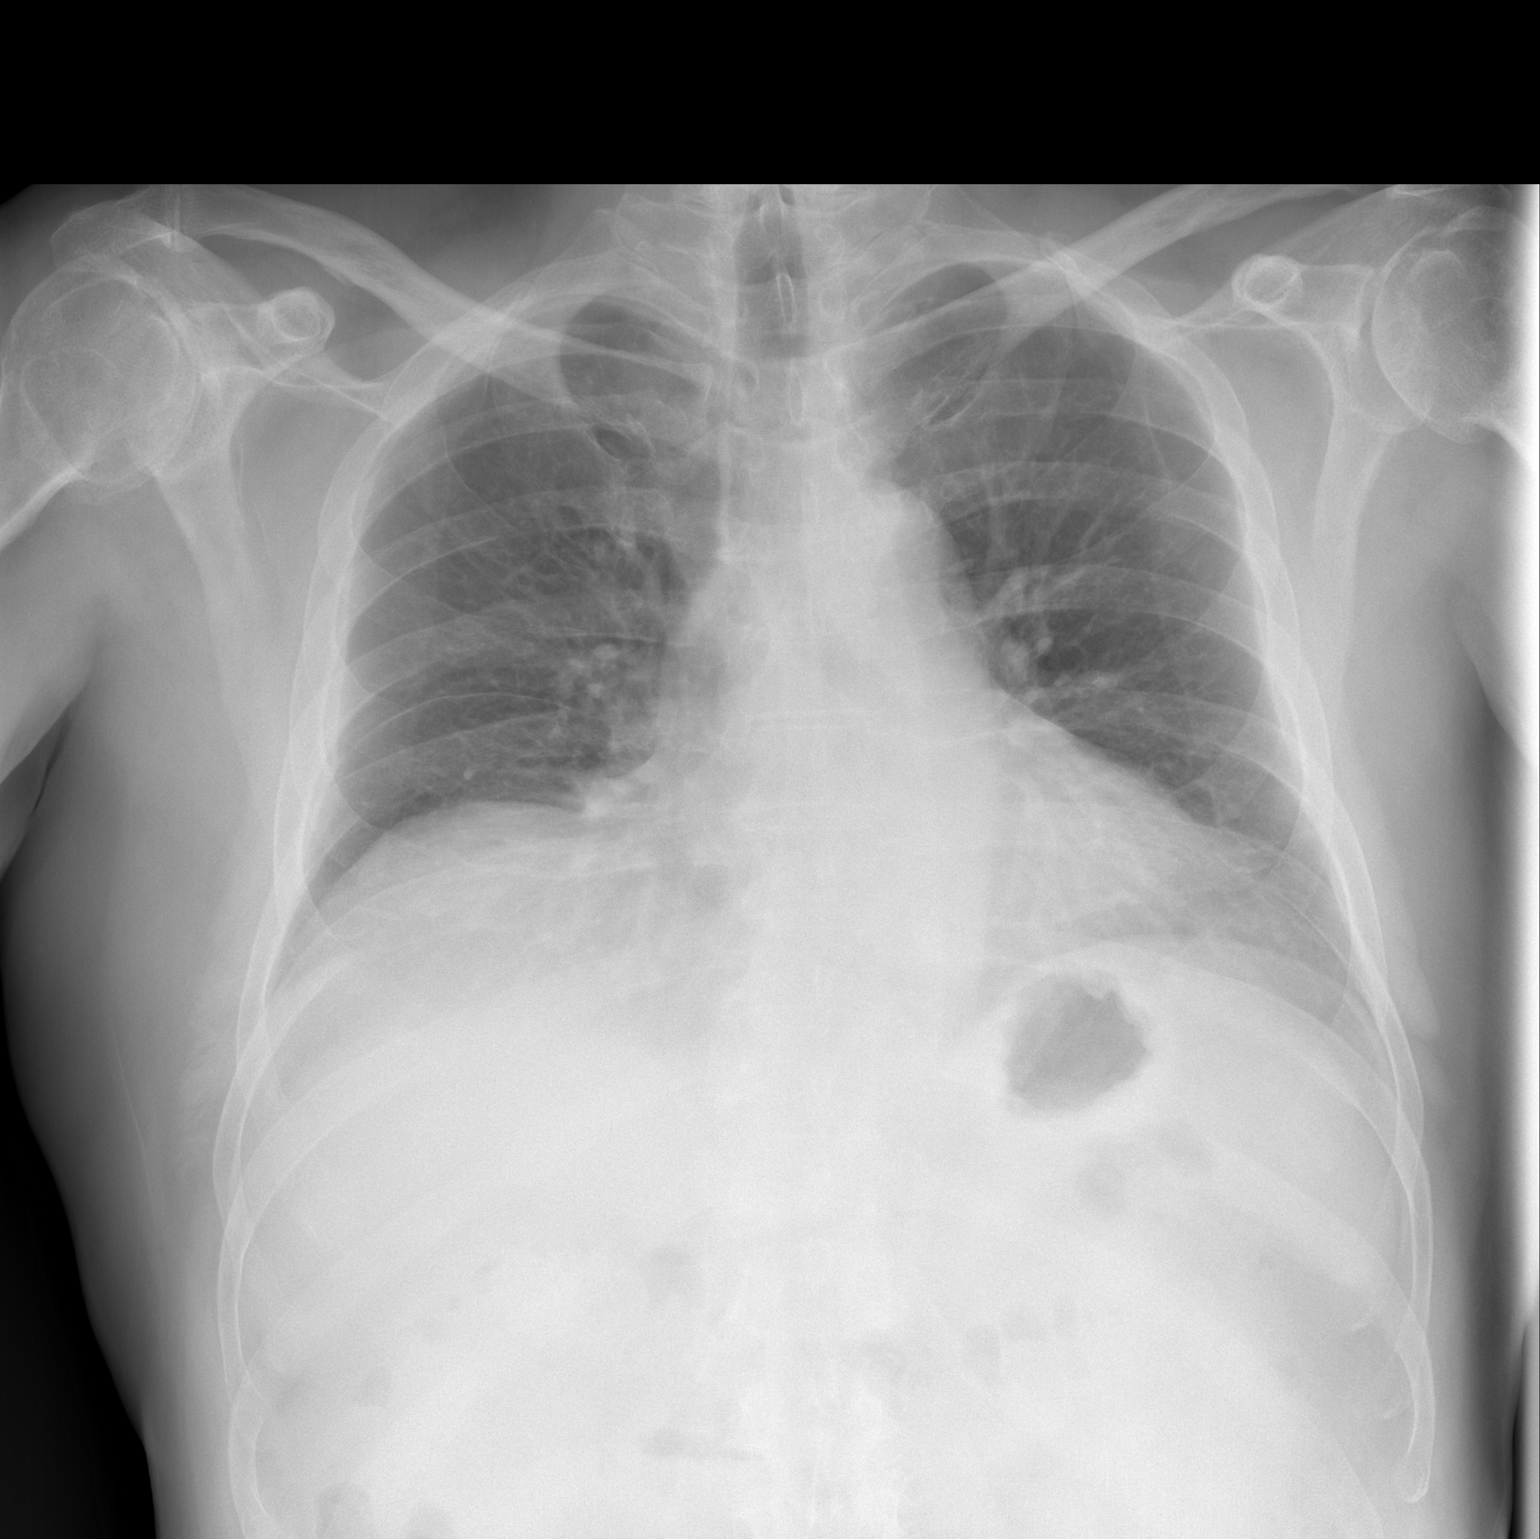

[w chest lat]
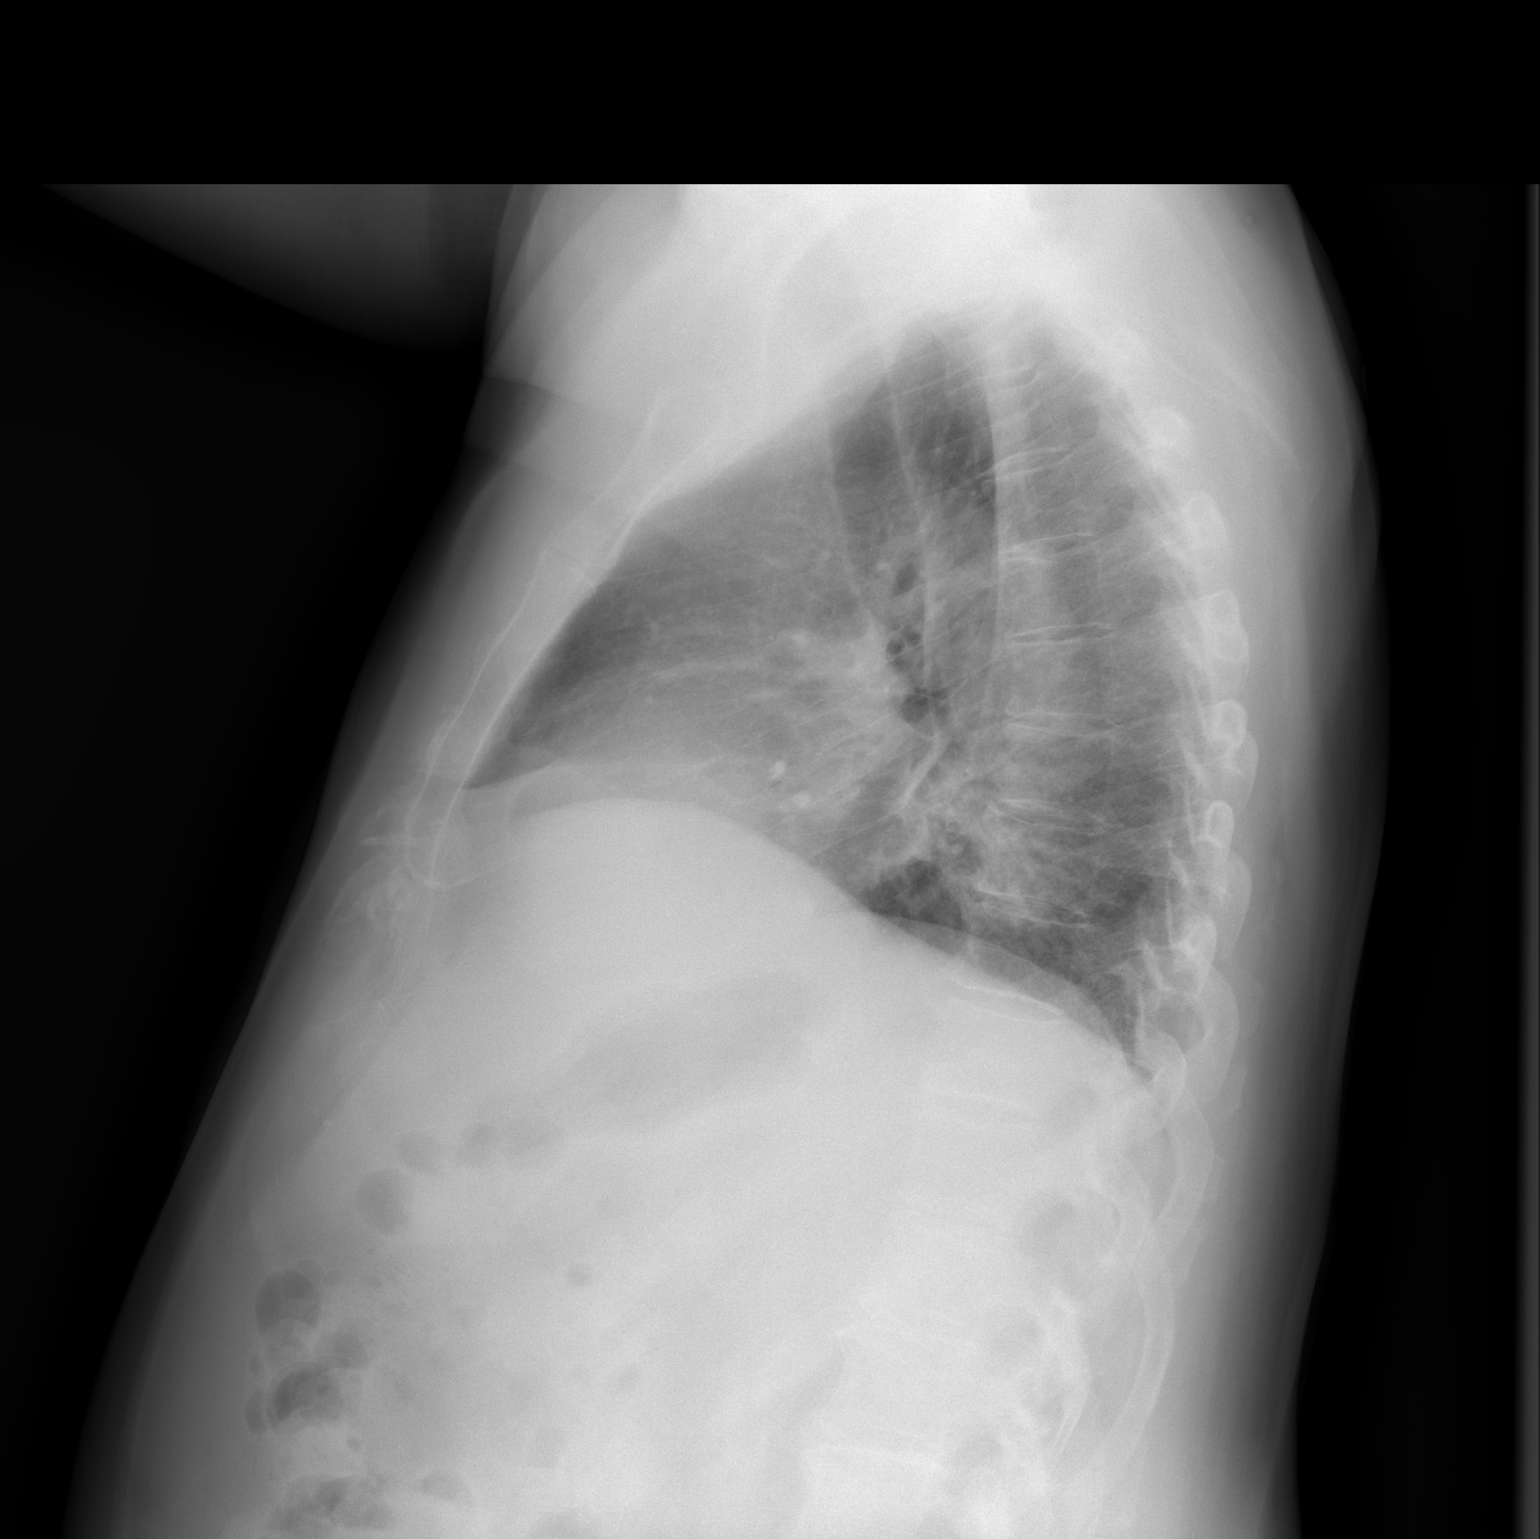

[2 of 2 positions shown; findings below may reference images not displayed]

FINDINGS: Low lung volumes. Stable cardiac and mediastinal contours. Bibasilar
opacities favored represent atelectasis. No large area pulmonary
consolidation. No pleural effusion or pneumothorax. Thoracic spine
degenerative changes.
IMPRESSION: Low lung volumes with bibasilar atelectasis.

## 2023-04-12 ENCOUNTER — Ambulatory Visit (INDEPENDENT_AMBULATORY_CARE_PROVIDER_SITE_OTHER): Payer: Medicare HMO | Admitting: Podiatry

## 2023-04-12 DIAGNOSIS — M79675 Pain in left toe(s): Secondary | ICD-10-CM

## 2023-04-12 DIAGNOSIS — M79674 Pain in right toe(s): Secondary | ICD-10-CM | POA: Diagnosis not present

## 2023-04-12 DIAGNOSIS — B351 Tinea unguium: Secondary | ICD-10-CM

## 2023-04-12 DIAGNOSIS — B353 Tinea pedis: Secondary | ICD-10-CM

## 2023-04-12 MED ORDER — KETOCONAZOLE 2 % EX CREA: 1.0000 | TOPICAL_CREAM | Freq: Every day | CUTANEOUS | 2 refills | Status: AC

## 2023-04-12 NOTE — Progress Notes (Signed)
Subjective:  Patient ID: Zachary Lopez, male    DOB: Oct 07, 1955,  MRN: [id number]  Zachary Lopez presents to clinic today for:  Chief Complaint  Patient presents with   [facility]    [facility] with last A1c of 8.9, Last seen his PCP 2 weeks ago. No anti coags.  ASL Interpreter Zachary Lopez is with him today.     Patient notes nails are thick, discolored, elongated and painful in shoegear when trying to ambulate.  He notes his heels have been dry and itchy.  PCP is Goins, Gwenith Spitz, FNP.  Past Medical History:  Diagnosis Date   Anginal pain (HCC)    Anxiety 11/07/2019   Arthritis    Benign prostatic hyperplasia with urinary frequency 11/27/2017   Cellulitis    Chest pain at rest 06/25/2020   Chronic kidney disease    Complication of anesthesia    Contracture of knee joint 12/21/2011   Controlled type 2 diabetes mellitus with hyperglycemia, without long-term current use of insulin (HCC) 08/13/2019   Deaf, bilateral 01/28/2016   Formatting of this note might be different from the original. Overview:  history of meningitis as a child Formatting of this note might be different from the original. history of meningitis as a child   Essential hypertension 03/29/2016   Formatting of this note might be different from the original. Overview:  monitoring at present per PCP, not on meds at present Formatting of this note might be different from the original. 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   Mute 04/04/2017   PONV (postoperative nausea and vomiting)    Prediabetes 03/29/2016   Primary osteoarthritis of right hip 07/08/2014   Seasonal allergic rhinitis due to pollen  06/24/2019    Past Surgical History:  Procedure Laterality Date   APPENDECTOMY     CHOLECYSTECTOMY     COLONOSCOPY     DENTAL SURGERY     ELBOW SURGERY Left    IRRIGATION AND DEBRIDEMENT KNEE Left 12/11/2010   Surgeon: Kayleen Memos, MD; [facility] Main OR   KNEE ARTHROPLASTY Left    ROBOTIC ASSITED PARTIAL NEPHRECTOMY Right 01/20/2021   Procedure: XI ROBOTIC ASSITED PARTIAL NEPHRECTOMY AND RENAL CYST DECORTICATION;  Surgeon: Sebastian Ache, MD;  Location: [facility]S;  Service: Urology;  Laterality: Right;  3 HRS   TOTAL HIP ARTHROPLASTY Right 07/08/2014   Procedure: RIGHT  TOTAL HIP ARTHROPLASTY ANTERIOR APPROACH;  Surgeon: Marcene Corning, MD;  Location: [facility] OR;  Service: Orthopedics;  Laterality: Right;    Allergies  Allergen Reactions   Cefepime Rash and Hives   Vancomycin Anaphylaxis    Review of Systems: Negative except as noted in the HPI.  Objective:  Zachary Lopez is a pleasant 67 y.o. male in NAD. AAO x 3.  Vascular Examination: Capillary refill time is 3-5 seconds to toes bilateral. Palpable pedal pulses b/l LE. Digital hair present b/l.  Skin temperature gradient WNL b/l. No varicosities b/l. No cyanosis noted b/l.   Dermatological Examination: Pedal skin with normal turgor, texture and tone b/l. No open wounds. No interdigital macerations b/l. Toenails x10 are 3mm thick, discolored, dystrophic with subungual debris. There  is pain with compression of the nail plates.  They are elongated x10.  The skin on the plantar aspect of both heels is dry, flaky with minimal erythema.  Assessment/Plan: 1. Pain due to onychomycosis of toenails of both feet   2. Tinea pedis of both feet     Meds ordered this encounter  Medications   ketoconazole (NIZORAL) 2 % cream    Sig: Apply 1 Application topically daily. Apply 1gm to heels every night.    Dispense:  60 g    Refill:  2   The mycotic toenails were sharply debrided x10 with sterile nail nippers and a power debriding burr to  decrease bulk/thickness and length.    Rx ketoconazole cream for every day application to the heels.  Return in about 3 months (around 07/11/2023) for [facility].   Zachary Lopez, DPM, FACFAS [facility]     [address] [place], [place] [address]                Office [phone]  Fax [phone]

## 2023-04-13 ENCOUNTER — Ambulatory Visit: Payer: Medicare HMO | Admitting: Podiatry

## 2023-07-12 ENCOUNTER — Ambulatory Visit (INDEPENDENT_AMBULATORY_CARE_PROVIDER_SITE_OTHER): Payer: Medicare HMO | Admitting: Podiatry

## 2023-07-12 DIAGNOSIS — M79674 Pain in right toe(s): Secondary | ICD-10-CM | POA: Diagnosis not present

## 2023-07-12 DIAGNOSIS — B351 Tinea unguium: Secondary | ICD-10-CM | POA: Diagnosis not present

## 2023-07-12 DIAGNOSIS — M79675 Pain in left toe(s): Secondary | ICD-10-CM

## 2023-07-12 NOTE — Progress Notes (Signed)
 Subjective:  Patient ID: Zachary Lopez, male    DOB: [birth year],  MRN: [id number]  Zachary Lopez presents to clinic today for:  Chief Complaint  Patient presents with   [facility]    Zachary Lopez with out callous. Last A1c was in Nov, it was 8.9, he goes on Mat 14th for recheck. No anti caog.    Patient notes nails are thick, discolored, elongated and painful in shoegear when trying to ambulate.    PCP is Goins, Jonetta Nest, FNP.  Past Medical History:  Diagnosis Date   Anginal pain (HCC)    Anxiety 11/07/2019   Arthritis    Benign prostatic hyperplasia with urinary frequency 11/27/2017   Cellulitis    Chest pain at rest 06/25/2020   Chronic kidney disease    Complication of anesthesia    Contracture of knee joint 12/21/2011   Controlled type 2 diabetes mellitus with hyperglycemia, without long-term current use of insulin  (HCC) 08/13/2019   Deaf, bilateral 01/28/2016   Formatting of this note might be different from the original. Overview:  history of meningitis as a child Formatting of this note might be different from the original. history of meningitis as a child   Essential hypertension 03/29/2016   Formatting of this note might be different from the original. Overview:  monitoring at present per PCP, not on meds at present Formatting of this note might be different from the original. 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   [place] 04/04/2017   PONV (postoperative nausea and vomiting)    Prediabetes 03/29/2016   Primary osteoarthritis of right hip 07/08/2014   Seasonal allergic rhinitis due to pollen 06/24/2019    Past Surgical History:  Procedure Laterality  Date   APPENDECTOMY     CHOLECYSTECTOMY     COLONOSCOPY     DENTAL SURGERY     ELBOW SURGERY Left    IRRIGATION AND DEBRIDEMENT KNEE Left 12/11/2010   Surgeon: Canary Ceo, MD; [facility] Main OR   KNEE ARTHROPLASTY Left    ROBOTIC ASSITED PARTIAL NEPHRECTOMY Right 01/20/2021   Procedure: XI ROBOTIC ASSITED PARTIAL NEPHRECTOMY AND RENAL CYST DECORTICATION;  Surgeon: Osborn Blaze, MD;  Location: [facility] ORS;  Service: Urology;  Laterality: Right;  3 HRS   TOTAL HIP ARTHROPLASTY Right 07/08/2014   Procedure: RIGHT  TOTAL HIP ARTHROPLASTY ANTERIOR APPROACH;  Surgeon: Dayne Even, MD;  Location: [facility] OR;  Service: Orthopedics;  Laterality: Right;    Allergies  Allergen Reactions   Cefepime Rash and Hives   Vancomycin Anaphylaxis    Review of Systems: Negative except as noted in the HPI.  Objective:  Zachary Lopez is a pleasant 68 y.o. male in NAD. AAO x 3.  Vascular Examination: Capillary refill time is 3-5 seconds to toes bilateral. Palpable pedal pulses b/l LE. Digital hair present b/l.  Skin temperature gradient WNL b/l. No varicosities b/l. No cyanosis noted b/l.   Dermatological Examination: Pedal skin with normal turgor, texture and tone b/l. No open wounds. No interdigital macerations b/l. Toenails x10 are 3mm thick, discolored, dystrophic with subungual debris. There is pain with compression of the nail plates.  They are  elongated x10.    Assessment/Plan: 1. Pain due to onychomycosis of toenails of both feet    The mycotic toenails were sharply debrided x10 with sterile nail nippers and a power debriding burr to decrease bulk/thickness and length.    Return in about 3 months (around 10/11/2023) for [facility]   Joe Murders, DPM, FACFAS [facility]     [address] [place], [place] [address]                Office [phone]  Fax [phone]

## 2023-10-11 ENCOUNTER — Ambulatory Visit (INDEPENDENT_AMBULATORY_CARE_PROVIDER_SITE_OTHER): Admitting: Podiatry

## 2023-10-11 DIAGNOSIS — M79674 Pain in right toe(s): Secondary | ICD-10-CM

## 2023-10-11 DIAGNOSIS — B351 Tinea unguium: Secondary | ICD-10-CM | POA: Diagnosis not present

## 2023-10-11 DIAGNOSIS — E1151 Type 2 diabetes mellitus with diabetic peripheral angiopathy without gangrene: Secondary | ICD-10-CM | POA: Diagnosis not present

## 2023-10-11 DIAGNOSIS — L84 Corns and callosities: Secondary | ICD-10-CM

## 2023-10-11 DIAGNOSIS — M79675 Pain in left toe(s): Secondary | ICD-10-CM | POA: Diagnosis not present

## 2023-10-11 NOTE — Progress Notes (Unsigned)
       Subjective:  Patient ID: Zachary Lopez, male    DOB: 1994-10-02,  MRN: [id number]   Zachary Lopez presents to clinic today for:  Chief Complaint  Patient presents with   [facility]    [facility] with callous. Last A1c 6.5 about a month ago and no anticoags.    Patient notes nails are thick, discolored, elongated and painful in shoegear when trying to ambulate.  She has bilateral submet 1 calluses as well.  PCP is Lucianne Lei, MD.  No past medical history on file.  No past surgical history on file.  Allergies  Allergen Reactions   Elemental Sulfur Other (See Comments)    Mom forgot reaction, thinks it maybe an upset stomach   Sulfa Antibiotics     Review of Systems: Negative except as noted in the HPI.  Objective:  Zachary Lopez is a pleasant 68 y.o. male in NAD. AAO x 3.  Vascular Examination: Capillary refill time is 3-5 seconds to toes bilateral. Palpable pedal pulses b/l LE. Digital hair present b/l.  Skin temperature gradient WNL b/l. No varicosities b/l. No cyanosis noted b/l.   Dermatological Examination: Pedal skin with normal turgor, texture and tone b/l. No open wounds. No interdigital macerations b/l. Toenails x10 are 3mm thick, discolored, dystrophic with subungual debris. There is pain with compression of the nail plates.  They are elongated x10.  There are hyperkeratotic lesions bilateral submet 1.  No surrounding erythema or evidence of ulceration is noted  Assessment/Plan: 1. Pain due to onychomycosis of toenails of both feet   2. Pre-ulcerative calluses   3. DM type 2 with diabetic peripheral neuropathy (HCC)     The mycotic toenails were sharply debrided x10 with sterile nail nippers and a power debriding burr to decrease bulk/thickness and length.  The hyperkeratotic lesions were shaved with sterile #[id number] blade uneventfully today.  Return in about 3 months (around 07/24/2023) for [facility].   Clerance Lav, DPM, FACFAS [facility]     [address] [place], [place] [address]                Office [phone]  Fax [phone]

## 2023-12-12 ENCOUNTER — Other Ambulatory Visit: Payer: Self-pay | Admitting: Cardiology

## 2024-01-10 ENCOUNTER — Ambulatory Visit: Admitting: Podiatry

## 2024-02-21 ENCOUNTER — Ambulatory Visit (INDEPENDENT_AMBULATORY_CARE_PROVIDER_SITE_OTHER): Admitting: Podiatry

## 2024-02-21 DIAGNOSIS — M79675 Pain in left toe(s): Secondary | ICD-10-CM | POA: Diagnosis not present

## 2024-02-21 DIAGNOSIS — B353 Tinea pedis: Secondary | ICD-10-CM | POA: Diagnosis not present

## 2024-02-21 DIAGNOSIS — L84 Corns and callosities: Secondary | ICD-10-CM | POA: Diagnosis not present

## 2024-02-21 DIAGNOSIS — E1151 Type 2 diabetes mellitus with diabetic peripheral angiopathy without gangrene: Secondary | ICD-10-CM | POA: Diagnosis not present

## 2024-02-21 DIAGNOSIS — B351 Tinea unguium: Secondary | ICD-10-CM | POA: Diagnosis not present

## 2024-02-21 DIAGNOSIS — M79674 Pain in right toe(s): Secondary | ICD-10-CM | POA: Diagnosis not present

## 2024-02-21 MED ORDER — CLOTRIMAZOLE-BETAMETHASONE 1-0.05 % EX CREA: 1.0000 | TOPICAL_CREAM | Freq: Two times a day (BID) | CUTANEOUS | 2 refills | Status: DC

## 2024-02-21 NOTE — Progress Notes (Signed)
 Subjective:  Patient ID: Zachary Lopez, male    DOB: 13-Dec-1955,  MRN: [id number]  Zachary Lopez presents to clinic today for:  Chief Complaint  Patient presents with   [facility]    [facility] A1c unknown glucose this AM 109.   Patient notes nails are thick and elongated, causing pain in shoe gear when ambulating.  He has painful callus left submet 5.  Also notes some itching and flaking of the skin mostly on the right foot.  A sign language interpreter is present today.  PCP is Goins, Darice BROCKS, FNP.  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 Overview:  history of meningitis as a child Formatting of this note might be different from the original. history of meningitis as a child   Essential hypertension 03/29/2016   Formatting of this note might be different from the original. Overview:  monitoring at present per PCP, not on meds at present Formatting of this note might be different from the original. 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 male in NAD. AAO x 3.  Vascular Examination: Patient has palpable DP pulse, absent PT pulse bilateral.  Delayed capillary refill bilateral toes.  Sparse digital hair bilateral.  Proximal to distal cooling WNL bilateral.    Dermatological Examination: Interspaces are clear with no open lesions noted bilateral.  Skin is shiny and atrophic bilateral.  Nails are 3-63mm thick, with yellowish/brown discoloration, subungual debris and distal onycholysis x10.  There is pain with compression of nails x10.  There are hyperkeratotic lesions noted left submet 5.  There is flaking and peeling of the skin more so on the right foot than the left.  No blister formation is noted.  Patient qualifies for at-risk foot care because of diabetes with PVD.  Assessment/Plan: 1. Pain due to onychomycosis of toenails of both feet   2. Callus of foot   3. Type II diabetes mellitus with peripheral circulatory disorder (HCC)   4. Tinea pedis of both feet     Meds ordered this encounter  Medications   clotrimazole-betamethasone (LOTRISONE) cream    Sig: Apply 1 Application topically 2 (two) times daily. Apply to bottom and sides of affected feet.    Dispense:  60 g    Refill:  2  Mycotic nails x10 were sharply debrided with sterile nail nippers and power debriding burr to decrease bulk and length.  Hyperkeratotic lesion left submet 5 was shaved with #[id number] blade.  Prescription for Lotrisone cream was sent to his pharmacy to apply twice daily to the sides and bottom of the affected feet.  Return in about 3 months (around 05/21/2024) for [facility].   Awanda CHARM Imperial, DPM, FACFAS [facility]     [address] [place], [place] [address]                Office [phone]  Fax [phone]

## 2024-03-18 ENCOUNTER — Telehealth: Payer: Self-pay | Admitting: Lab

## 2024-03-18 NOTE — Telephone Encounter (Signed)
 Patient calling stating foot is red and extremely itchy please advise [phone].

## 2024-03-20 ENCOUNTER — Ambulatory Visit: Admitting: Podiatry

## 2024-03-20 DIAGNOSIS — L299 Pruritus, unspecified: Secondary | ICD-10-CM

## 2024-03-20 DIAGNOSIS — R21 Rash and other nonspecific skin eruption: Secondary | ICD-10-CM

## 2024-03-20 MED ORDER — BETAMETHASONE DIPROPIONATE 0.05 % EX CREA: TOPICAL_CREAM | Freq: Two times a day (BID) | CUTANEOUS | 3 refills | Status: AC

## 2024-03-20 NOTE — Progress Notes (Signed)
 "    Chief Complaint  Patient presents with   Rash    Itchy, flaking skin posterior ankles. Ketoconazole  cream not helping.    HPI: 69 y.o. male presents today with a sign language interpreter for an itchy rash that he feels is getting worse on the dorsal midfoot and medial ankle.  He had been prescribed Lotrisone  cream but states that it has been ineffective.  Also, ketoconazole  cream has not been very effective in the past.  Denies any blistering or drainage.  Past Medical History:  Diagnosis Date   Anginal pain    Anxiety 11/07/2019   Arthritis    Benign prostatic hyperplasia with urinary frequency 11/27/2017   Cellulitis    Chest pain at rest 06/25/2020   Chronic kidney disease    Complication of anesthesia    Contracture of knee joint 12/21/2011   Controlled type 2 diabetes mellitus with hyperglycemia, without long-term current use of insulin  (HCC) 08/13/2019   Deaf, bilateral 01/28/2016   Formatting of this note might be different from the original. Overview:  history of meningitis as a child Formatting of this note might be different from the original. history of meningitis as a child   Essential hypertension 03/29/2016   Formatting of this note might be different from the original. Overview:  monitoring at present per PCP, not on meds at present Formatting of this note might be different from the original. 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   [facility] 04/04/2017   PONV (postoperative nausea and vomiting)    Prediabetes 03/29/2016   Primary osteoarthritis of right hip 07/08/2014   Seasonal allergic rhinitis due to pollen 06/24/2019   Past Surgical History:   Procedure Laterality Date   APPENDECTOMY     CHOLECYSTECTOMY     COLONOSCOPY     DENTAL SURGERY     ELBOW SURGERY Left    IRRIGATION AND DEBRIDEMENT KNEE Left 12/11/2010   Surgeon: Montie Macario Cable, MD; [facility] Main OR   KNEE ARTHROPLASTY Left    ROBOTIC ASSITED PARTIAL NEPHRECTOMY Right 01/20/2021   Procedure: XI ROBOTIC ASSITED PARTIAL NEPHRECTOMY AND RENAL CYST DECORTICATION;  Surgeon: Alvaro Hummer, MD;  Location: [facility];  Service: Urology;  Laterality: Right;  3 HRS   TOTAL HIP ARTHROPLASTY Right 07/08/2014   Procedure: RIGHT  TOTAL HIP ARTHROPLASTY ANTERIOR APPROACH;  Surgeon: Maude Herald, MD;  Location: [facility] OR;  Service: Orthopedics;  Laterality: Right;   Allergies[1] Review of Systems  Skin:  Positive for itching and rash.      Physical Exam: Palpable pedal pulses noted.  There is an eczematous type of reaction on the dorsal midfoot as well as the medial ankle.  No vesicle formation is noted.  No bullous formation is seen.  No significant erythema is present.  No areas of raw skin are noted.  Assessment/Plan of Care: 1. Rash   2. Pruritus      Meds ordered this encounter  Medications   betamethasone  dipropionate 0.05 % cream    Sig: Apply topically 2 (two) times daily.    Dispense:  45 g    Refill:  3   Patient informed this  appears slightly more eczematous than tinea in appearance today.  Will have him D/C the Lotrisone  cream and will prescribe a stronger prescription steroid cream.  Betamethasone  dipropionate 0.05% cream was sent to the pharmacy for twice daily application.  May add some type of zinc oxide paste formula in the future.  Follow-up as scheduled   Harolyn Cocker DSABRA Imperial, DPM, FACFAS [facility]     [address] [place], [place] [address]                Office [phone]  Fax [phone]    [1]  Allergies Allergen Reactions   Cefepime Rash and Hives   Vancomycin Anaphylaxis and Other (See  Comments)   "

## 2024-05-22 ENCOUNTER — Ambulatory Visit: Admitting: Podiatry
